# Patient Record
Sex: Female | Born: 1975 | Race: Black or African American | Hispanic: No | Marital: Single | Smoking: Current every day smoker
Health system: Southern US, Community
[De-identification: ages and names within clinical notes are randomized; demographics above are authoritative.]

## PROBLEM LIST (undated history)

## (undated) DIAGNOSIS — D649 Anemia, unspecified: Secondary | ICD-10-CM

## (undated) DIAGNOSIS — E7800 Pure hypercholesterolemia, unspecified: Secondary | ICD-10-CM

## (undated) DIAGNOSIS — I10 Essential (primary) hypertension: Secondary | ICD-10-CM

## (undated) HISTORY — PX: CHOLECYSTECTOMY: SHX55

---

## 2019-05-16 ENCOUNTER — Emergency Department (HOSPITAL_BASED_OUTPATIENT_CLINIC_OR_DEPARTMENT_OTHER)
Admission: EM | Admit: 2019-05-16 | Discharge: 2019-05-16 | Disposition: A | Discharge status: Home / Self Care | Payer: Medicaid - Out of State | Attending: Emergency Medicine | Admitting: Emergency Medicine

## 2019-05-16 ENCOUNTER — Ambulatory Visit: Payer: Medicaid - Out of State | Admitting: Nurse Practitioner

## 2019-05-16 ENCOUNTER — Encounter (HOSPITAL_BASED_OUTPATIENT_CLINIC_OR_DEPARTMENT_OTHER): Payer: Self-pay | Admitting: Emergency Medicine

## 2019-05-16 ENCOUNTER — Emergency Department (HOSPITAL_BASED_OUTPATIENT_CLINIC_OR_DEPARTMENT_OTHER): Payer: Medicaid - Out of State

## 2019-05-16 ENCOUNTER — Other Ambulatory Visit: Payer: Self-pay

## 2019-05-16 DIAGNOSIS — I10 Essential (primary) hypertension: Secondary | ICD-10-CM | POA: Diagnosis not present

## 2019-05-16 DIAGNOSIS — Z79899 Other long term (current) drug therapy: Secondary | ICD-10-CM | POA: Diagnosis not present

## 2019-05-16 DIAGNOSIS — E876 Hypokalemia: Secondary | ICD-10-CM

## 2019-05-16 DIAGNOSIS — R202 Paresthesia of skin: Secondary | ICD-10-CM | POA: Insufficient documentation

## 2019-05-16 DIAGNOSIS — R600 Localized edema: Secondary | ICD-10-CM | POA: Diagnosis present

## 2019-05-16 HISTORY — DX: Pure hypercholesterolemia, unspecified: E78.00

## 2019-05-16 HISTORY — DX: Anemia, unspecified: D64.9

## 2019-05-16 HISTORY — DX: Essential (primary) hypertension: I10

## 2019-05-16 LAB — CBC WITH DIFFERENTIAL/PLATELET
Abs Immature Granulocytes: 0.05 10*3/uL (ref 0.00–0.07)
Basophils Absolute: 0.1 10*3/uL (ref 0.0–0.1)
Basophils Relative: 1 %
Eosinophils Absolute: 0.3 10*3/uL (ref 0.0–0.5)
Eosinophils Relative: 3 %
HCT: 35.9 % — ABNORMAL LOW (ref 36.0–46.0)
Hemoglobin: 11.2 g/dL — ABNORMAL LOW (ref 12.0–15.0)
Immature Granulocytes: 1 %
Lymphocytes Relative: 27 %
Lymphs Abs: 2.8 10*3/uL (ref 0.7–4.0)
MCH: 27.8 pg (ref 26.0–34.0)
MCHC: 31.2 g/dL (ref 30.0–36.0)
MCV: 89.1 fL (ref 80.0–100.0)
Monocytes Absolute: 0.6 10*3/uL (ref 0.1–1.0)
Monocytes Relative: 5 %
Neutro Abs: 6.6 10*3/uL (ref 1.7–7.7)
Neutrophils Relative %: 63 %
Platelets: 513 10*3/uL — ABNORMAL HIGH (ref 150–400)
RBC: 4.03 MIL/uL (ref 3.87–5.11)
RDW: 15.7 % — ABNORMAL HIGH (ref 11.5–15.5)
WBC: 10.4 10*3/uL (ref 4.0–10.5)
nRBC: 0 % (ref 0.0–0.2)

## 2019-05-16 LAB — URINALYSIS, ROUTINE W REFLEX MICROSCOPIC
Bilirubin Urine: NEGATIVE
Glucose, UA: NEGATIVE mg/dL
Ketones, ur: NEGATIVE mg/dL
Leukocytes,Ua: NEGATIVE
Nitrite: NEGATIVE
Protein, ur: NEGATIVE mg/dL
Specific Gravity, Urine: 1.02 (ref 1.005–1.030)
pH: 6.5 (ref 5.0–8.0)

## 2019-05-16 LAB — PREGNANCY, URINE: Preg Test, Ur: NEGATIVE

## 2019-05-16 LAB — COMPREHENSIVE METABOLIC PANEL
ALT: 15 U/L (ref 0–44)
AST: 14 U/L — ABNORMAL LOW (ref 15–41)
Albumin: 3.7 g/dL (ref 3.5–5.0)
Alkaline Phosphatase: 78 U/L (ref 38–126)
Anion gap: 8 (ref 5–15)
BUN: 13 mg/dL (ref 6–20)
CO2: 30 mmol/L (ref 22–32)
Calcium: 8.9 mg/dL (ref 8.9–10.3)
Chloride: 99 mmol/L (ref 98–111)
Creatinine, Ser: 0.77 mg/dL (ref 0.44–1.00)
GFR calc Af Amer: >60 mL/min (ref >60)
GFR calc non Af Amer: >60 mL/min (ref >60)
Glucose, Bld: 101 mg/dL — ABNORMAL HIGH (ref 70–99)
Potassium: 2.9 mmol/L — ABNORMAL LOW (ref 3.5–5.1)
Sodium: 137 mmol/L (ref 135–145)
Total Bilirubin: 0.7 mg/dL (ref 0.3–1.2)
Total Protein: 7.5 g/dL (ref 6.5–8.1)

## 2019-05-16 LAB — URINALYSIS, MICROSCOPIC (REFLEX)

## 2019-05-16 LAB — MAGNESIUM: Magnesium: 2.1 mg/dL (ref 1.7–2.4)

## 2019-05-16 LAB — CK: Total CK: 97 U/L (ref 38–234)

## 2019-05-16 MED ORDER — POTASSIUM CHLORIDE CRYS ER 20 MEQ PO TBCR
40.0000 meq | EXTENDED_RELEASE_TABLET | Freq: Once | ORAL | Status: AC
  Administered 2019-05-16: 40 meq via ORAL
  Filled 2019-05-16: qty 2

## 2019-05-16 MED ORDER — POTASSIUM CHLORIDE CRYS ER 20 MEQ PO TBCR: 40.0000 meq | EXTENDED_RELEASE_TABLET | Freq: Two times a day (BID) | ORAL | 0 refills | Status: DC

## 2019-05-16 NOTE — ED Triage Notes (Signed)
Intermittent Tingling, numbness and tightness in both legs.  Pt states hot and cold.  Occurring for one week.  Left ankle swelling.  No injury.  Pt able to move legs.

## 2019-05-16 NOTE — ED Provider Notes (Signed)
[facility] DEPARTMENT Provider Note   CSN: [id number] Arrival date & time: 05/16/19  1029     History Chief Complaint  Patient presents with  . Leg Pain    Cathy Chang is a 44 y.o. female.  HPI 44 year old female presents with bilateral leg pain.  Ongoing for about a week or so.  Primarily she feels tingling from just proximal to her knee down on both legs.  Also some burning pain.  Sometimes aching.  Today she has noticed a little bit of ankle swelling but otherwise has not really noticed any swelling.  Sometimes swelling at the end of the day but no focal swelling or calf pain/swelling.  The pain seems to come and go.  Currently it is not really present.  No new back pain though she does chronically have some mild low back pain.  No radicular back pain.  No weakness in her extremities. No injuries. No fevers.  Past Medical History:  Diagnosis Date  . Anemia   . Hypercholesteremia   . Hypertension     There are no problems to display for this patient.   Past Surgical History:  Procedure Laterality Date  . CESAREAN SECTION    . CHOLECYSTECTOMY       OB History   No obstetric history on file.     History reviewed. No pertinent family history.  Social History   Tobacco Use  . Smoking status: Never Smoker  . Smokeless tobacco: Never Used  Substance Use Topics  . Alcohol use: Not on file  . Drug use: Not on file    Home Medications Prior to Admission medications   Medication Sig Start Date End Date Taking? Authorizing Provider  amLODipine (NORVASC) 5 MG tablet Take 5 mg by mouth daily. 04/27/19   [provider]  cholecalciferol (VITAMIN D3) 25 MCG (1000 UNIT) tablet Take 1,000 Units by mouth daily. 04/25/19   [provider]  [employer] VITAMIN B12 1000 MCG TBCR Take 1 tablet by mouth daily. 04/27/19   [provider]  EPINEPHrine 0.3 mg/0.3 mL IJ SOAJ injection FOR USE IN CASE OF SEVERE ALLERGIC REACTION, INJECT AND CALL  911 03/28/19   [provider]  metoprolol succinate (TOPROL-XL) 50 MG 24 hr tablet Take 50 mg by mouth daily. 04/27/19   [provider]  potassium chloride SA (KLOR-CON) 20 MEQ tablet Take 2 tablets (40 mEq total) by mouth 2 (two) times daily for 5 days. 05/16/19 05/21/19  Sherwood Gambler, MD  pravastatin (PRAVACHOL) 40 MG tablet Take 40 mg by mouth at bedtime. 04/27/19   [provider]    Allergies    Nsaids and Lisinopril  Review of Systems   Review of Systems  Cardiovascular: Negative for leg swelling.  Gastrointestinal: Negative for diarrhea.  Musculoskeletal: Positive for back pain (chronic, unchanged) and myalgias.  Neurological: Positive for numbness (tingling). Negative for weakness.  All other systems reviewed and are negative.   Physical Exam Updated Vital Signs BP 136/85 (BP Location: Right Arm)   Pulse 81   Temp 98.3 F (36.8 C) (Oral)   Resp 20   Ht 5\' 2"  (1.575 m)   Wt (!) 139.3 kg   LMP 05/03/2019   SpO2 100%   BMI 56.16 kg/m   Physical Exam Vitals and nursing note reviewed.  Constitutional:      General: She is not in acute distress.    Appearance: She is well-developed. She is obese. She is not ill-appearing or diaphoretic.  HENT:     Head: Normocephalic and atraumatic.     Right Ear: External ear normal.     Left Ear: External ear normal.     Nose: Nose normal.  Eyes:     General:        Right eye: No discharge.        Left eye: No discharge.  Cardiovascular:     Rate and Rhythm: Normal rate and regular rhythm.     Pulses:          Dorsalis pedis pulses are 2+ on the right side and 2+ on the left side.     Heart sounds: Normal heart sounds.  Pulmonary:     Effort: Pulmonary effort is normal.     Breath sounds: Normal breath sounds.  Abdominal:     Palpations: Abdomen is soft.  Musculoskeletal:     Thoracic back: No tenderness.     Lumbar back: No tenderness.     Right hip: Normal range of motion.     Left hip:  Normal range of motion.     Right upper leg: No tenderness.     Left upper leg: No tenderness.     Right knee: No swelling. Normal range of motion. No tenderness.     Left knee: No swelling. Normal range of motion. No tenderness.     Right lower leg: No swelling or tenderness.     Left lower leg: No swelling or tenderness.     Right ankle: No swelling. No tenderness.     Left ankle: Swelling (minimal ankle swelling) present. No tenderness. Normal range of motion.  Skin:    General: Skin is warm and dry.  Neurological:     Mental Status: She is alert.     Comments: 5/5 strength in BLE. Normal gross sensation in lower extremities.  Psychiatric:        Mood and Affect: Mood is not anxious.     ED Results / Procedures / Treatments   Labs (all labs ordered are listed, but only abnormal results are displayed) Labs Reviewed  URINALYSIS, ROUTINE W REFLEX MICROSCOPIC - Abnormal; Notable for the following components:      Result Value   APPearance HAZY (*)    Hgb urine dipstick TRACE (*)    All other components within normal limits  COMPREHENSIVE METABOLIC PANEL - Abnormal; Notable for the following components:   Potassium 2.9 (*)    Glucose, Bld 101 (*)    AST 14 (*)    All other components within normal limits  CBC WITH DIFFERENTIAL/PLATELET - Abnormal; Notable for the following components:   Hemoglobin 11.2 (*)    HCT 35.9 (*)    RDW 15.7 (*)    Platelets 513 (*)    All other components within normal limits  URINALYSIS, MICROSCOPIC (REFLEX) - Abnormal; Notable for the following components:   Bacteria, UA FEW (*)    All other components within normal limits  PREGNANCY, URINE  MAGNESIUM  CK    EKG EKG Interpretation  Date/Time:  Monday May 16 2019 12:13:08 EST Ventricular Rate:  76 PR Interval:    QRS Duration: 97 QT Interval:  383 QTC Calculation: 431 R Axis:   48 Text Interpretation: Sinus rhythm Low voltage, precordial leads Baseline wander in lead(s) I III aVR  aVL aVF V3 No old tracing to compare Confirmed by Pricilla Loveless [phone]) on 05/16/2019 12:16:00 PM   Radiology US Venous Img Lower Bilateral  Result Date: 05/16/2019 CLINICAL DATA:  44 year old female with a history of calf pain EXAM: BILATERAL LOWER EXTREMITY VENOUS DOPPLER ULTRASOUND TECHNIQUE: Gray-scale sonography with graded compression, as well as color Doppler and duplex ultrasound were performed to evaluate the lower extremity deep venous systems from the level of the common femoral vein and including the common femoral, femoral, profunda femoral, popliteal and calf veins including the posterior tibial, peroneal and gastrocnemius veins when visible. The superficial great saphenous vein was also interrogated. Spectral Doppler was utilized to evaluate flow at rest and with distal augmentation maneuvers in the common femoral, femoral and popliteal veins. COMPARISON:  None. FINDINGS: RIGHT LOWER EXTREMITY Common Femoral Vein: No evidence of thrombus. Normal compressibility, respiratory phasicity and response to augmentation. Saphenofemoral Junction: No evidence of thrombus. Normal compressibility and flow on color Doppler imaging. Profunda Femoral Vein: No evidence of thrombus. Normal compressibility and flow on color Doppler imaging. Femoral Vein: No evidence of thrombus. Normal compressibility, respiratory phasicity and response to augmentation. Popliteal Vein: No evidence of thrombus. Normal compressibility, respiratory phasicity and response to augmentation. Calf Veins: No evidence of thrombus. Normal compressibility and flow on color Doppler imaging. Superficial Great Saphenous Vein: No evidence of thrombus. Normal compressibility and flow on color Doppler imaging. Other Findings:  None. LEFT LOWER EXTREMITY Common Femoral Vein: No evidence of thrombus. Normal compressibility, respiratory phasicity and response to augmentation. Saphenofemoral Junction: No evidence of thrombus. Normal compressibility and  flow on color Doppler imaging. Profunda Femoral Vein: No evidence of thrombus. Normal compressibility and flow on color Doppler imaging. Femoral Vein: No evidence of thrombus. Normal compressibility, respiratory phasicity and response to augmentation. Popliteal Vein: No evidence of thrombus. Normal compressibility, respiratory phasicity and response to augmentation. Calf Veins: No evidence of thrombus. Normal compressibility and flow on color Doppler imaging. Superficial Great Saphenous Vein: No evidence of thrombus. Normal compressibility and flow on color Doppler imaging. Other Findings:  None. IMPRESSION: Sonographic survey of the bilateral lower extremities negative for DVT Electronically Signed   By: Gilmer Mor D.O.   On: 05/16/2019 12:21    Procedures Procedures (including critical care time)  Medications Ordered in ED Medications  potassium chloride SA (KLOR-CON) CR tablet 40 mEq (40 mEq Oral Given 05/16/19 1205)    ED Course  I have reviewed the triage vital signs and the nursing notes.  Pertinent labs & imaging results that were available during my care of the patient were reviewed by me and considered in my medical decision making (see chart for details).    MDM Rules/Calculators/A&P                      Patient is anxious about possibly having a DVT.  DVT ultrasound is negative.  She has no unilateral swelling or even swelling compared to her baseline.  Probably her paresthesia type symptoms are coming from moderate hypokalemia.  ECG is unremarkable for changes related to potassium.  She appears stable for outpatient repletement and follow-up with her PCP.  Unclear exact cause though Norvasc or metoprolol probably would not be causing it.  Otherwise her neurologic exam is benign including normal strength and sensation.  Will discharge home with return precautions. Final Clinical Impression(s) / ED Diagnoses Final diagnoses:  Hypokalemia  Paresthesia of bilateral legs    Rx / DC  Orders ED Discharge Orders         Ordered    potassium chloride SA (KLOR-CON) 20 MEQ tablet  2 times daily     05/16/19 1230  Pricilla Loveless, MD 05/16/19 1233

## 2019-05-18 ENCOUNTER — Ambulatory Visit: Payer: Self-pay | Admitting: Adult Health Nurse Practitioner

## 2019-05-20 ENCOUNTER — Encounter: Payer: Self-pay | Admitting: Adult Health Nurse Practitioner

## 2019-05-24 ENCOUNTER — Encounter: Payer: Self-pay | Admitting: Adult Health Nurse Practitioner

## 2019-06-27 ENCOUNTER — Telehealth: Payer: Self-pay

## 2019-06-27 ENCOUNTER — Encounter: Payer: Self-pay | Admitting: Adult Health Nurse Practitioner

## 2019-06-27 ENCOUNTER — Ambulatory Visit: Payer: Medicaid - Out of State

## 2019-06-27 NOTE — Telephone Encounter (Signed)
Pt was a NS for 0845 appt in [facility] on 06/27/19.  TC place to pt to offer reschedule, no answer.  Pt needs in person appt. SChaplin, RN,BSN

## 2019-07-19 ENCOUNTER — Other Ambulatory Visit: Payer: Self-pay

## 2019-07-19 ENCOUNTER — Encounter: Payer: Self-pay | Admitting: Internal Medicine

## 2019-07-19 ENCOUNTER — Ambulatory Visit: Payer: No Typology Code available for payment source | Admitting: Internal Medicine

## 2019-07-19 ENCOUNTER — Ambulatory Visit (HOSPITAL_COMMUNITY)
Admission: RE | Admit: 2019-07-19 | Discharge: 2019-07-19 | Disposition: A | Discharge status: Home / Self Care | Payer: No Typology Code available for payment source | Source: Ambulatory Visit | Attending: Internal Medicine | Admitting: Internal Medicine

## 2019-07-19 VITALS — BP 138/75 | HR 90 | Temp 98.5°F | Ht 62.0 in | Wt 302.6 lb

## 2019-07-19 DIAGNOSIS — M25562 Pain in left knee: Secondary | ICD-10-CM | POA: Insufficient documentation

## 2019-07-19 DIAGNOSIS — F321 Major depressive disorder, single episode, moderate: Secondary | ICD-10-CM

## 2019-07-19 DIAGNOSIS — D649 Anemia, unspecified: Secondary | ICD-10-CM

## 2019-07-19 DIAGNOSIS — M25561 Pain in right knee: Secondary | ICD-10-CM | POA: Diagnosis not present

## 2019-07-19 DIAGNOSIS — E876 Hypokalemia: Secondary | ICD-10-CM | POA: Diagnosis not present

## 2019-07-19 DIAGNOSIS — G8929 Other chronic pain: Secondary | ICD-10-CM

## 2019-07-19 DIAGNOSIS — R3911 Hesitancy of micturition: Secondary | ICD-10-CM

## 2019-07-19 DIAGNOSIS — Z79899 Other long term (current) drug therapy: Secondary | ICD-10-CM

## 2019-07-19 DIAGNOSIS — D509 Iron deficiency anemia, unspecified: Secondary | ICD-10-CM

## 2019-07-19 DIAGNOSIS — I10 Essential (primary) hypertension: Secondary | ICD-10-CM | POA: Diagnosis not present

## 2019-07-19 NOTE — Patient Instructions (Signed)
Cathy Chang, It was a pleasure meeting you today. We are glad to have you establishing in our clinic.   Today we discussed: 1. Your blood pressure: I will have you stop your current medications. I'm going to check some labs today and call you in a medication depending on the results. I will let you know when I have these results back.   2. Knee pain - we will get x-rays today.   3. Mental health - I have referred you to [facility], who is our in-house behavioral counselor   I am also checking some other labs to look into your potassium and your anemia  nd will let you know the results of this testing.   We'll plan to have you back in 2-3 weeks to meet your primary care doctor and continue addressing your concerns.   Take care,  Dr. Chesley Mires

## 2019-07-19 NOTE — Progress Notes (Signed)
New Patient Office Visit  Subjective:  Patient ID: Cathy Chang, female    DOB: 05-02-1975  Age: 44 y.o. MRN: [id number]  CC:  Chief Complaint  Patient presents with  . Knee Pain    HPI Cathy Chang presents to establish care for management of chronic HTN, knee pain, depression. Please see problem based charting for details regarding today's visit.   Past Medical History:  Diagnosis Date  . Anemia   . Hypercholesteremia   . Hypertension     Past Surgical History:  Procedure Laterality Date  . CESAREAN SECTION    . CHOLECYSTECTOMY      Family History  Problem Relation Age of Onset  . Lung cancer Mother   . Diabetes Father   . Hypertension Father   . Lung cancer Maternal Grandmother     Social History   Socioeconomic History  . Marital status: Single    Spouse name: Not on file  . Number of children: Not on file  . Years of education: Not on file  . Highest education level: Not on file  Occupational History  . Not on file  Tobacco Use  . Smoking status: Never Smoker  . Smokeless tobacco: Never Used  Substance and Sexual Activity  . Alcohol use: Yes    Comment: occasional   . Drug use: Yes    Types: Marijuana    Comment: daily   . Sexual activity: Not on file  Other Topics Concern  . Not on file  Social History Narrative  . Not on file   Social Determinants of Health   Financial Resource Strain:   . Difficulty of Paying Living Expenses:   Food Insecurity:   . Worried About Programme researcher, broadcasting/film/video in the Last Year:   . Barista in the Last Year:   Transportation Needs:   . Freight forwarder (Medical):   Marland Kitchen Lack of Transportation (Non-Medical):   Physical Activity:   . Days of Exercise per Week:   . Minutes of Exercise per Session:   Stress:   . Feeling of Stress :   Social Connections:   . Frequency of Communication with Friends and Family:   . Frequency of Social Gatherings with Friends and Family:   . Attends Religious Services:     . Active Member of Clubs or Organizations:   . Attends Banker Meetings:   Marland Kitchen Marital Status:   Intimate Partner Violence:   . Fear of Current or Ex-Partner:   . Emotionally Abused:   Marland Kitchen Physically Abused:   . Sexually Abused:     ROS Review of Systems  Constitutional: Negative for activity change, fever and unexpected weight change.  HENT: Negative for congestion, sinus pain and sore throat.   Eyes: Negative for discharge and visual disturbance.  Respiratory: Negative for cough and shortness of breath.   Cardiovascular: Negative for chest pain and palpitations.  Gastrointestinal: Negative for abdominal pain, nausea and vomiting.  Genitourinary: Positive for difficulty urinating. Negative for hematuria and menstrual problem.  Musculoskeletal: Positive for arthralgias.  Skin: Negative for rash.  Neurological: Negative for dizziness, weakness, light-headedness and headaches.  Psychiatric/Behavioral: Negative for agitation, hallucinations and suicidal ideas.    Objective:   Today's Vitals: BP 138/75 (BP Location: Right Arm, Patient Position: Sitting, Cuff Size: Small)   Pulse 90   Temp 98.5 F (36.9 C) (Oral)   Ht 5\' 2"  (1.575 m)   Wt (!) 302 lb 9.6 oz (137.3 kg)  SpO2 100%   BMI 55.35 kg/m   Physical Exam Constitutional:      General: She is not in acute distress.    Appearance: Normal appearance.  Eyes:     Conjunctiva/sclera: Conjunctivae normal.  Cardiovascular:     Rate and Rhythm: Normal rate and regular rhythm.  Pulmonary:     Effort: Pulmonary effort is normal.     Breath sounds: Normal breath sounds.  Abdominal:     General: Bowel sounds are normal. There is no distension.     Palpations: Abdomen is soft.     Tenderness: There is no abdominal tenderness.  Musculoskeletal:     Cervical back: Neck supple.     Left knee: Crepitus present. No effusion. Decreased range of motion. Tenderness present over the medial joint line and lateral joint line.  No LCL laxity, MCL laxity, ACL laxity or PCL laxity.    Right lower leg: Edema present.     Left lower leg: Edema present.  Skin:    General: Skin is warm and dry.  Neurological:     General: No focal deficit present.     Mental Status: She is alert and oriented to person, place, and time.  Psychiatric:        Mood and Affect: Mood normal.        Behavior: Behavior normal.     Assessment & Plan:   Problem List Items Addressed This Visit      Cardiovascular and Mediastinum   Essential hypertension    Patient has been on amlodipine and Metoprolol for over 10 years to treat her HTN. She has noticed lower extremity swelling recently and would like to try alternative therapy. I think she would respond well to HCTZ, but with history of hypokalemia will combine it with triamterene. Re-check BMP at upcoming PCP visit on 4/20.       Relevant Medications   triamterene-hydrochlorothiazide (MAXZIDE-25) 37.5-25 MG tablet     Other   Hypokalemia    Patient was found to have a potassium of 2.9 at recent ED visit in February. She was prescribed supplementation. Re-check today shows normal value of 3.8.  Will obtain aldosterone/renin to screen for primary aldosteronism given concurrent HTN as well.       Relevant Orders   BMP8+Anion Gap (Completed)   Aldosterone/Renin   Chronic pain of both knees - Primary    Progressive knee pain, L>R. X-rays are unremarkable. She denies recent trauma or an inciting event. I suspect the primary cause of her knee pain is her weight which we discussed today. She had mentioned being interested in bariatric surgery referral which I think she would be a good candidate for. Can continue discussing this with her PCP. Would advise conservative management for knee pain with NSAIDs and staying as mobile as possible for now.       Relevant Orders   DG Knee Complete 4 Views Left (Completed)   DG Knee Complete 4 Views Right (Completed)   Anemia    Recent CBC from ED  visit shows anemia with elevated RDW and platelet count, indicating possible iron deficiency. Iron studies at today's visit confirm this. She reports that she has gotten iron transfusions in the past. Will attempt oral replacement therapy first and have her PCP re-check in 6 weeks.       Relevant Medications   ferrous sulfate 325 (65 FE) MG tablet   Other Relevant Orders   Iron and IBC ([id number]) (Completed)   Ferritin (Completed)  Current moderate episode of major depressive disorder [facility])    Patient reports longstanding psychiatric history related to difficult childhood and trauma of losing her son to a police shooting. She has previously been on various medications including SSRIs and benzos. Not currently on any therapy. She would prefer to try behavioral therapy first so will place referral to [facility] today.       Relevant Orders   Ambulatory referral to Integrated Behavioral [facility]    Other Visit Diagnoses    Urinary hesitancy       Relevant Orders   Urinalysis, Complete (81001) (Completed)      Outpatient Encounter Medications as of 07/19/2019  Medication Sig  . amLODipine (NORVASC) 5 MG tablet Take 5 mg by mouth daily.  . cholecalciferol (VITAMIN D3) 25 MCG (1000 UNIT) tablet Take 1,000 Units by mouth daily.  . [employer]S VITAMIN B12 1000 MCG TBCR Take 1 tablet by mouth daily.  Marland Kitchen EPINEPHrine 0.3 mg/0.3 mL IJ SOAJ injection FOR USE IN CASE OF SEVERE ALLERGIC REACTION, INJECT AND CALL 911  . ferrous sulfate 325 (65 FE) MG tablet Take 1 tablet (325 mg total) by mouth daily with breakfast.  . metoprolol succinate (TOPROL-XL) 50 MG 24 hr tablet Take 50 mg by mouth daily.  . pravastatin (PRAVACHOL) 40 MG tablet Take 40 mg by mouth at bedtime.  . triamterene-hydrochlorothiazide (MAXZIDE-25) 37.5-25 MG tablet Take 1 tablet by mouth daily.  . [DISCONTINUED] potassium chloride SA (KLOR-CON) 20 MEQ tablet Take 2 tablets (40 mEq total) by mouth 2 (two) times daily for 5 days.   No  facility-administered encounter medications on file as of 07/19/2019.    Follow-up: Return in about 4 weeks (around 08/16/2019) for HTN, establish with PCP (prefers female).   Bridget Hartshorn, DO

## 2019-07-20 ENCOUNTER — Telehealth: Payer: Self-pay | Admitting: Internal Medicine

## 2019-07-20 ENCOUNTER — Encounter: Payer: Self-pay | Admitting: Internal Medicine

## 2019-07-20 DIAGNOSIS — D649 Anemia, unspecified: Secondary | ICD-10-CM | POA: Insufficient documentation

## 2019-07-20 DIAGNOSIS — E876 Hypokalemia: Secondary | ICD-10-CM | POA: Insufficient documentation

## 2019-07-20 DIAGNOSIS — G8929 Other chronic pain: Secondary | ICD-10-CM | POA: Insufficient documentation

## 2019-07-20 DIAGNOSIS — I10 Essential (primary) hypertension: Secondary | ICD-10-CM | POA: Insufficient documentation

## 2019-07-20 DIAGNOSIS — F321 Major depressive disorder, single episode, moderate: Secondary | ICD-10-CM | POA: Insufficient documentation

## 2019-07-20 DIAGNOSIS — D509 Iron deficiency anemia, unspecified: Secondary | ICD-10-CM | POA: Insufficient documentation

## 2019-07-20 LAB — URINALYSIS, COMPLETE
Bilirubin, UA: NEGATIVE
Glucose, UA: NEGATIVE
Leukocytes,UA: NEGATIVE
Nitrite, UA: NEGATIVE
RBC, UA: NEGATIVE
Specific Gravity, UA: 1.029 (ref 1.005–1.030)
Urobilinogen, Ur: 1 mg/dL (ref 0.2–1.0)
pH, UA: 6.5 (ref 5.0–7.5)

## 2019-07-20 LAB — BMP8+ANION GAP
Anion Gap: 17 mmol/L (ref 10.0–18.0)
BUN/Creatinine Ratio: 11 (ref 9–23)
BUN: 9 mg/dL (ref 6–24)
CO2: 25 mmol/L (ref 20–29)
Calcium: 9.5 mg/dL (ref 8.7–10.2)
Chloride: 97 mmol/L (ref 96–106)
Creatinine, Ser: 0.79 mg/dL (ref 0.57–1.00)
GFR calc Af Amer: 105 mL/min/{1.73_m2} (ref >59)
GFR calc non Af Amer: 91 mL/min/{1.73_m2} (ref >59)
Glucose: 101 mg/dL — ABNORMAL HIGH (ref 65–99)
Potassium: 3.8 mmol/L (ref 3.5–5.2)
Sodium: 139 mmol/L (ref 134–144)

## 2019-07-20 LAB — IRON AND TIBC
Iron Saturation: 7 % — CL (ref 15–55)
Iron: 27 ug/dL (ref 27–159)
Total Iron Binding Capacity: 393 ug/dL (ref 250–450)
UIBC: 366 ug/dL (ref 131–425)

## 2019-07-20 LAB — MICROSCOPIC EXAMINATION
Casts: NONE SEEN /lpf
RBC, Urine: >30 /hpf — AB (ref 0–2)
WBC, UA: >30 /hpf — AB (ref 0–5)

## 2019-07-20 LAB — FERRITIN: Ferritin: 7 ng/mL — ABNORMAL LOW (ref 15–150)

## 2019-07-20 MED ORDER — TRIAMTERENE-HCTZ 37.5-25 MG PO TABS: 1.0000 | ORAL_TABLET | Freq: Every day | ORAL | 2 refills | Status: DC

## 2019-07-20 MED ORDER — FERROUS SULFATE 325 (65 FE) MG PO TABS: 325.0000 mg | ORAL_TABLET | Freq: Every day | ORAL | 3 refills | Status: DC

## 2019-07-20 NOTE — Assessment & Plan Note (Signed)
Patient was found to have a potassium of 2.9 at recent ED visit in February. She was prescribed supplementation. Re-check today shows normal value of 3.8.  Will obtain aldosterone/renin to screen for primary aldosteronism given concurrent HTN as well.

## 2019-07-20 NOTE — Assessment & Plan Note (Signed)
Patient reports longstanding psychiatric history related to difficult childhood and trauma of losing her son to a police shooting. She has previously been on various medications including SSRIs and benzos. Not currently on any therapy. She would prefer to try behavioral therapy first so will place referral to [facility] today.

## 2019-07-20 NOTE — Progress Notes (Signed)
Internal Medicine Clinic Attending  Case discussed with Dr. Bloomfield at the time of the visit.  We reviewed the resident's history and exam and pertinent patient test results.  I agree with the assessment, diagnosis, and plan of care documented in the resident's note.  

## 2019-07-20 NOTE — Assessment & Plan Note (Signed)
Progressive knee pain, L>R. X-rays are unremarkable. She denies recent trauma or an inciting event. I suspect the primary cause of her knee pain is her weight which we discussed today. She had mentioned being interested in bariatric surgery referral which I think she would be a good candidate for. Can continue discussing this with her PCP. Would advise conservative management for knee pain with NSAIDs and staying as mobile as possible for now.

## 2019-07-20 NOTE — Telephone Encounter (Signed)
Called patient and left secure voicemail. Lab results showed iron deficiency; have sent in prescription for daily supplementation and will need re-check in about 6 weeks.  Her potassium level has normalized, so have sent in the new blood pressure medication that we discussed at her visit yesterday (HCTZ-triamterene).

## 2019-07-20 NOTE — Assessment & Plan Note (Signed)
Recent CBC from ED visit shows anemia with elevated RDW and platelet count, indicating possible iron deficiency. Iron studies at today's visit confirm this. She reports that she has gotten iron transfusions in the past. Will attempt oral replacement therapy first and have her PCP re-check in 6 weeks.

## 2019-07-20 NOTE — Assessment & Plan Note (Signed)
Patient has been on amlodipine and Metoprolol for over 10 years to treat her HTN. She has noticed lower extremity swelling recently and would like to try alternative therapy. I think she would respond well to HCTZ, but with history of hypokalemia will combine it with triamterene. Re-check BMP at upcoming PCP visit on 4/20.

## 2019-07-21 ENCOUNTER — Telehealth: Payer: Self-pay

## 2019-07-21 ENCOUNTER — Other Ambulatory Visit: Payer: Self-pay | Admitting: Internal Medicine

## 2019-07-21 DIAGNOSIS — D500 Iron deficiency anemia secondary to blood loss (chronic): Secondary | ICD-10-CM

## 2019-07-21 NOTE — Progress Notes (Signed)
Patient noted to have iron deficiency anemia secondary to menorrhagia. She does not tolerate oral iron supplementation so will proceed with iron transfusion.

## 2019-07-21 NOTE — Telephone Encounter (Signed)
Received TC from patient, states she can't take oral iron pills.  She has tried to take this many times in the past and has N/V.  She was on iron infusions.  Wants to speak to Dr. Chesley Mires about this.  Pt is calling her previous PCP to get records. Call her on her mobile. SChaplin, RN,BSN

## 2019-07-21 NOTE — Telephone Encounter (Signed)
Returned patient's call. I had ordered for 2 doses of IV feraheme, but patient would prefer to hold off until we can get her records from [place] so we can see what specifically they gave her due to allergy concerns. Provided her with fax number for clinic.

## 2019-07-27 LAB — ALDOSTERONE + RENIN ACTIVITY W/ RATIO
ALDOS/RENIN RATIO: 38.2 — ABNORMAL HIGH (ref 0.0–30.0)
ALDOSTERONE: 56.7 ng/dL — ABNORMAL HIGH (ref 0.0–30.0)
Renin: 1.484 ng/mL/hr (ref 0.167–5.380)

## 2019-07-28 ENCOUNTER — Ambulatory Visit (INDEPENDENT_AMBULATORY_CARE_PROVIDER_SITE_OTHER): Payer: No Typology Code available for payment source | Admitting: Internal Medicine

## 2019-07-28 ENCOUNTER — Telehealth: Payer: Self-pay | Admitting: Internal Medicine

## 2019-07-28 DIAGNOSIS — M25562 Pain in left knee: Secondary | ICD-10-CM

## 2019-07-28 DIAGNOSIS — G8929 Other chronic pain: Secondary | ICD-10-CM

## 2019-07-28 MED ORDER — DICLOFENAC SODIUM 1 % EX GEL: 4.0000 g | Freq: Four times a day (QID) | CUTANEOUS | 1 refills | Status: AC

## 2019-07-28 NOTE — Telephone Encounter (Signed)
Pt calls and states she is not any better in fact she is worse.  Acc [id number] 4/15

## 2019-07-28 NOTE — Assessment & Plan Note (Addendum)
Patient continues to have left knee pain with intermittent swelling. This has been going on for approximately 6 months, but seems to be worsening recently. She denies any trauma or injury. Endorses occasional buckling or the sensation that it is "giving out." X-ray was unremarkable.  She is hesitant to take any systemic NSAIDs for pain due to prior adverse effects. Will send in topical Voltaren for pain. MRI of left knee ordered and have referred her to orthopedics.  We have discussed importance of weight loss and the benefits it will have on her knee pain in particular. She is motivated to do so and plans on discussing possibility of bariatric surgery in the future with her PCP.

## 2019-07-28 NOTE — Telephone Encounter (Signed)
Pt is wanting a nurse call back [phone]

## 2019-07-28 NOTE — Progress Notes (Signed)
  [facility] Res[facility] Telephone Encounter Continuity Care Appointment  HPI:   This telephone encounter was created for [name] on 08/02/2019 for the following purpose/cc left knee.   Past Medical History:  Past Medical History:  Diagnosis Date  . Anemia   . Hypercholesteremia   . Hypertension      ROS:   No history of trauma, recent falls. No lower extremity weakness.    Assessment / Plan / Recommendations:   Please see A&P under problem oriented charting for assessment of the patient's acute and chronic medical conditions.   As always, pt is advised that if symptoms worsen or new symptoms arise, they should go to an urgent care facility or to to ER for further evaluation.   Consent and Medical Decision Making:   Patient discussed with Dr. Criselda Peaches  This is a telephone encounter between [facility] and Bridget Hartshorn on 08/02/2019 for left knee pain. The visit was conducted with the patient located at home and Bridget Hartshorn at [facility]. The patient's identity was confirmed using their DOB and current address. The patient has consented to being evaluated through a telephone encounter and understands the associated risks (an examination cannot be done and the patient may need to come in for an appointment) / benefits (allows the patient to remain at home, decreasing exposure to coronavirus). I personally spent 22 minutes on medical discussion.

## 2019-08-01 ENCOUNTER — Telehealth: Payer: Self-pay | Admitting: Licensed Clinical Social Worker

## 2019-08-01 NOTE — Telephone Encounter (Signed)
Patient was called to discuss referral for services. Patient reported that she was unsure about services at this time, and that she would call the office back at a time that was convenient for her.

## 2019-08-02 ENCOUNTER — Encounter: Payer: Self-pay | Admitting: Internal Medicine

## 2019-08-02 ENCOUNTER — Ambulatory Visit (INDEPENDENT_AMBULATORY_CARE_PROVIDER_SITE_OTHER): Payer: No Typology Code available for payment source | Admitting: Internal Medicine

## 2019-08-02 ENCOUNTER — Other Ambulatory Visit: Payer: Self-pay

## 2019-08-02 VITALS — BP 137/73 | HR 74 | Temp 98.4°F | Ht 62.0 in | Wt 307.1 lb

## 2019-08-02 DIAGNOSIS — E876 Hypokalemia: Secondary | ICD-10-CM

## 2019-08-02 DIAGNOSIS — F329 Major depressive disorder, single episode, unspecified: Secondary | ICD-10-CM

## 2019-08-02 DIAGNOSIS — Z6841 Body Mass Index (BMI) 40.0 and over, adult: Secondary | ICD-10-CM | POA: Insufficient documentation

## 2019-08-02 DIAGNOSIS — E785 Hyperlipidemia, unspecified: Secondary | ICD-10-CM | POA: Insufficient documentation

## 2019-08-02 DIAGNOSIS — N898 Other specified noninflammatory disorders of vagina: Secondary | ICD-10-CM

## 2019-08-02 DIAGNOSIS — D509 Iron deficiency anemia, unspecified: Secondary | ICD-10-CM

## 2019-08-02 DIAGNOSIS — E7800 Pure hypercholesterolemia, unspecified: Secondary | ICD-10-CM | POA: Diagnosis not present

## 2019-08-02 DIAGNOSIS — D500 Iron deficiency anemia secondary to blood loss (chronic): Secondary | ICD-10-CM

## 2019-08-02 DIAGNOSIS — I10 Essential (primary) hypertension: Secondary | ICD-10-CM | POA: Diagnosis not present

## 2019-08-02 DIAGNOSIS — M25562 Pain in left knee: Secondary | ICD-10-CM | POA: Diagnosis not present

## 2019-08-02 DIAGNOSIS — Z79899 Other long term (current) drug therapy: Secondary | ICD-10-CM

## 2019-08-02 DIAGNOSIS — G8929 Other chronic pain: Secondary | ICD-10-CM

## 2019-08-02 NOTE — Patient Instructions (Addendum)
Cathy Chang,   It was a pleasure seeing you in clinic today. Today we discussed:  1. Hypertension - Please continue to take your medications as prescribed. I will check lab work today. You would benefit from continued weight loss and can initiate your bariatric surgery process by visiting the following website and attending a weight loss seminar: [email or web address]  2. Anemia - I will order your iron infusions and check further labs for vitamin B12 and vitamin D.  3. Vaginal discharge - Please schedule an appointment for a pap smear at your earliest convenience.    Please contact us if you have any questions or concerns at [phone]   Thank you!

## 2019-08-02 NOTE — Assessment & Plan Note (Addendum)
Pateint with history of iron deficiency anemia requiring frequent iron infusions while under the care of her PCP and hematologist. Patient notes that she had adverse reactions with other iron infusions and notes that she is only able to tolerate Venofer 200mg  IV. She notes that her hematologist has recommended this and also recommended methylprednisolone 125mg  IV if she were to have any adverse reactions.  - IV venofer 200mg  IV weekly - Vitamin D and B12 levels

## 2019-08-02 NOTE — Assessment & Plan Note (Signed)
Patient found to be hypokalemic on ED visit in 05/2019 and prescribed supplementation. On 4/7, repeat labs with K wnl. Aldosterone/renin ratio obtained to screen for primary hyperaldosteronism given her long standing hypertension. Aldosterone and aldosterone/renin ratio elevated; however, her hypertension seems to be responding appropriately to medications at this time. Will continue to monitor.

## 2019-08-02 NOTE — Progress Notes (Signed)
   CC: hypertension f/u  HPI:  [name] is a 44 y.o. female with PMHx of hypertension, iron deficiency anemia, and hypercholesterolemia presenting for follow up of her hypertension. Please see problem based charting for further assessment and plan.  Past Medical History:  Diagnosis Date  . Anemia   . Hypercholesteremia   . Hypertension    Review of Systems:   Review of Systems  Constitutional: Positive for malaise/fatigue. Negative for chills, fever and weight loss.  Eyes: Negative for blurred vision and double vision.  Respiratory: Negative for cough, shortness of breath and wheezing.   Cardiovascular: Negative for chest pain and palpitations.  Gastrointestinal: Negative for abdominal pain, diarrhea and vomiting.  Genitourinary: Negative for dysuria.       Vaginal discharge - brownish in color   Musculoskeletal: Positive for joint pain. Negative for back pain, myalgias and neck pain.  Neurological: Negative for dizziness, tingling, sensory change, focal weakness, weakness and headaches.  Psychiatric/Behavioral: Positive for depression. Negative for hallucinations, memory loss and suicidal ideas. The patient is not nervous/anxious and does not have insomnia.      Physical Exam: Vitals:   08/02/19 1549  BP: 137/73  Pulse: 74  Temp: 98.4 F (36.9 C)  TempSrc: Oral  SpO2: 98%  Weight: (!) 307 lb 1.6 oz (139.3 kg)  Height: 5\' 2"  (1.575 m)   Physical Exam Constitutional:      General: She is not in acute distress.    Appearance: She is obese. She is not diaphoretic.  HENT:     Head: Normocephalic and atraumatic.  Eyes:     Extraocular Movements: Extraocular movements intact.     Conjunctiva/sclera: Conjunctivae normal.  Cardiovascular:     Rate and Rhythm: Normal rate and regular rhythm.     Pulses: Normal pulses.     Heart sounds: Normal heart sounds. No murmur. No friction rub. No gallop.   Pulmonary:     Effort: Pulmonary effort is normal. No respiratory  distress.     Breath sounds: Normal breath sounds. No wheezing.  Abdominal:     General: Bowel sounds are normal.     Palpations: Abdomen is soft.     Tenderness: There is no abdominal tenderness. There is no guarding.  Musculoskeletal:        General: No swelling or tenderness. Normal range of motion.  Skin:    General: Skin is warm and dry.     Capillary Refill: Capillary refill takes less than 2 seconds.     Findings: No erythema.  Neurological:     General: No focal deficit present.     Mental Status: She is alert and oriented to person, place, and time. Mental status is at baseline.     Sensory: No sensory deficit.     Motor: No weakness.      Assessment & Plan:   See Encounters Tab for problem based charting.  Patient discussed with Dr. 

## 2019-08-02 NOTE — Assessment & Plan Note (Signed)
Patient on chronic statin therapy with pravastatin 40mg  daily. Has been tolerating well.   - Lipid panel today.

## 2019-08-02 NOTE — Assessment & Plan Note (Signed)
Patient reports ongoing left knee pain with intermittent swelling that is unchanged from prior exam. Patient's prior x-rays unremarkable and pain is persistent despite topical voltaren gel. MRI left knee ordered and patient has appointment with orthopedic surgeon tomorrow.  - F/u MRI - F/u orthopedic surgery recommendations

## 2019-08-02 NOTE — Assessment & Plan Note (Addendum)
Patient with BMI 56. Discussed and encouraged weight loss. Patient expresses interest at this time. She notes that she is motivated to lose weight and is interested in bariatric surgery. Reference given to attend seminar.  Patient also recommended for lifestyle modification with exercise and low sodium diet.  -Referral to bariatric surgeon once patient has completed seminar  - Encourage lifestyle modification

## 2019-08-02 NOTE — Assessment & Plan Note (Signed)
BP 137/73 at this visit. Goal 120/80. Patient previously on metoprolol and amlodipine for >10 years for hypertension. However, at previous visit patient started on triamterene-HCTZ for concerns of lower extremity edema. Patient notes improvement in her edema today. BP is stable since last visit.  Renin, aldosterone and aldosterone/renin ratio checked at last visit. Aldosterone and A:R ratio elevated which would be concerning for primary hyperaldosteronism; however, patient is responding well to current therapy and does not appear that this is resistant hypertension at this time.   - Continue triamterene-HCTZ 37.5-25mg  daily  - BMP today - Lifestyle modifications encouraged

## 2019-08-03 NOTE — Progress Notes (Signed)
Internal Medicine Clinic Attending  Case discussed with Dr. Bloomfield  at the time of the visit.  We reviewed the resident's history and pertinent patient test results.  I agree with the assessment, diagnosis, and plan of care documented in the resident's note.  

## 2019-08-03 NOTE — Progress Notes (Signed)
Internal Medicine Clinic Attending  Case discussed with Dr. Aslam at the time of the visit.  We reviewed the resident's history and exam and pertinent patient test results.  I agree with the assessment, diagnosis, and plan of care documented in the resident's note.  

## 2019-08-04 ENCOUNTER — Ambulatory Visit: Payer: No Typology Code available for payment source | Admitting: Orthopaedic Surgery

## 2019-08-11 LAB — BMP8+ANION GAP
Anion Gap: 16 mmol/L (ref 10.0–18.0)
BUN/Creatinine Ratio: 18 (ref 9–23)
BUN: 14 mg/dL (ref 6–24)
CO2: 25 mmol/L (ref 20–29)
Calcium: 9 mg/dL (ref 8.7–10.2)
Chloride: 98 mmol/L (ref 96–106)
Creatinine, Ser: 0.8 mg/dL (ref 0.57–1.00)
GFR calc Af Amer: 104 mL/min/{1.73_m2} (ref >59)
GFR calc non Af Amer: 90 mL/min/{1.73_m2} (ref >59)
Glucose: 89 mg/dL (ref 65–99)
Potassium: 3.3 mmol/L — ABNORMAL LOW (ref 3.5–5.2)
Sodium: 139 mmol/L (ref 134–144)

## 2019-08-11 LAB — VITAMIN D 1,25 DIHYDROXY
Vitamin D 1, 25 (OH)2 Total: 56 pg/mL
Vitamin D2 1, 25 (OH)2: <10 pg/mL
Vitamin D3 1, 25 (OH)2: 50 pg/mL

## 2019-08-11 LAB — LIPID PANEL
Chol/HDL Ratio: 4.2 ratio (ref 0.0–4.4)
Cholesterol, Total: 183 mg/dL (ref 100–199)
HDL: 44 mg/dL (ref >39)
LDL Chol Calc (NIH): 87 mg/dL (ref 0–99)
Triglycerides: 314 mg/dL — ABNORMAL HIGH (ref 0–149)
VLDL Cholesterol Cal: 52 mg/dL — ABNORMAL HIGH (ref 5–40)

## 2019-08-11 LAB — VITAMIN B12: Vitamin B-12: 590 pg/mL (ref 232–1245)

## 2019-08-12 ENCOUNTER — Telehealth: Payer: Self-pay

## 2019-08-12 NOTE — Telephone Encounter (Signed)
Return call to pt - stated she needs GI referral; c/o constipation, abd swollen and having gas. Stated she had talked to Dr Mcarthur Rossetti at last visit about this but was not mentioned in her notes. Stated she's taking OTC meds - stool softener, gas-x and may try Miralax. Also she asked if we had received records from [facility]; told her no after checking w/front office. Also asked about bariatric surgery referral - informed she needs to call [phone].  Also stated she concern about low iron level. She's currently out of town, in [place]. She has an appt already scheduled on 5/6 for abd pain in ACC. Told her I will inform her doctor of her concerns.

## 2019-08-12 NOTE — Telephone Encounter (Signed)
Requesting to speak with a nurse about constipation, please call pt back.

## 2019-08-15 NOTE — Telephone Encounter (Signed)
Will follow up at patient's visit in [facility] on 5/6

## 2019-08-18 ENCOUNTER — Ambulatory Visit: Payer: No Typology Code available for payment source | Admitting: Orthopaedic Surgery

## 2019-08-18 ENCOUNTER — Ambulatory Visit: Payer: No Typology Code available for payment source

## 2019-08-19 ENCOUNTER — Encounter: Payer: Self-pay | Admitting: Internal Medicine

## 2019-08-19 ENCOUNTER — Other Ambulatory Visit: Payer: Self-pay

## 2019-08-19 ENCOUNTER — Ambulatory Visit (INDEPENDENT_AMBULATORY_CARE_PROVIDER_SITE_OTHER): Payer: No Typology Code available for payment source | Admitting: Internal Medicine

## 2019-08-19 DIAGNOSIS — I10 Essential (primary) hypertension: Secondary | ICD-10-CM | POA: Diagnosis not present

## 2019-08-19 DIAGNOSIS — Z79899 Other long term (current) drug therapy: Secondary | ICD-10-CM

## 2019-08-19 DIAGNOSIS — R109 Unspecified abdominal pain: Secondary | ICD-10-CM | POA: Insufficient documentation

## 2019-08-19 DIAGNOSIS — G8929 Other chronic pain: Secondary | ICD-10-CM

## 2019-08-19 DIAGNOSIS — F4310 Post-traumatic stress disorder, unspecified: Secondary | ICD-10-CM

## 2019-08-19 DIAGNOSIS — M25562 Pain in left knee: Secondary | ICD-10-CM

## 2019-08-19 DIAGNOSIS — R1013 Epigastric pain: Secondary | ICD-10-CM

## 2019-08-19 DIAGNOSIS — Z6841 Body Mass Index (BMI) 40.0 and over, adult: Secondary | ICD-10-CM

## 2019-08-19 MED ORDER — HYDROCHLOROTHIAZIDE 25 MG PO TABS: 25.0000 mg | ORAL_TABLET | Freq: Every day | ORAL | 0 refills | Status: DC

## 2019-08-19 MED ORDER — OLMESARTAN MEDOXOMIL 20 MG PO TABS: 20.0000 mg | ORAL_TABLET | Freq: Every day | ORAL | 0 refills | Status: DC

## 2019-08-19 NOTE — Patient Instructions (Addendum)
Ms. Covino,   It was a pleasure seeing you in clinic. Today we discussed:  Abdominal discomfort and constipation: At this time, I would recommend trying Mylanta or Maalox for the gas. I also recommend small, frequent meals to avoid the acid from building up and I advise to avoid spicy food at this time. For your constipation, I would recommend adding Metamucil daily.   Hypertension: I will discontinue your current medications and start on Olmesartan 20mg  daily. I will check your BP and lab work at the next visit.   Weight loss: You would benefit from continued weight loss and can initiate your bariatric surgery process by visiting the following website and attending a weight loss seminar: You can call [phone] once you have completed the seminar.   Knee pain: Please schedule the MRI of your left knee and follow up with Dr. [phone] (orthopedic) at your earliest convenience. [facility]: [phone]  For your anemia: I have ordered IV iron infusions for you. You will be contacted to schedule this. I will await for further records from your PCP's office at this time.   PTSD: I will place a referral to our therapist, [phone]. She will reach out to you over the next few weeks.   Pap smear: We will schedule this for your next visit.   Please contact Phineas Semen if you have any questions or concerns at [phone]  Thank you!

## 2019-08-19 NOTE — Progress Notes (Signed)
   CC: abdominal discomfort and constipation  HPI:  Cathy Chang is a 44 y.o. female with PMHx as listed below presenting with abdominal discomfort and constipation for past few weeks. She notes overall decreased appetite but notes that she has been having abdominal discomfort with increasing gas. She notes being constipated while on vacation in [place] a few weeks ago for which she took Miralax and Dulcolax with improvement and has since been having regular bowel movements, although some liquid stools this morning. Please see problem based charting for A/P of chronic issues.   Past Medical History:  Diagnosis Date  . Anemia   . Hypercholesteremia   . Hypertension    Review of Systems:  Negative except as stated in HPI.   Physical Exam:  Vitals:   08/19/19 1014 08/19/19 1025  BP: (!) 143/69 138/70  Pulse: 70 67  Temp: 97.8 F (36.6 C)   TempSrc: Oral   SpO2: 100%   Weight: (!) 303 lb 4.8 oz (137.6 kg)   Height: 5\' 2"  (1.575 m)    Physical Exam Constitutional:      General: She is not in acute distress.    Appearance: She is obese. She is not diaphoretic.  HENT:     Head: Normocephalic and atraumatic.  Eyes:     General: No scleral icterus.    Extraocular Movements: Extraocular movements intact.  Cardiovascular:     Rate and Rhythm: Normal rate and regular rhythm.     Heart sounds: Normal heart sounds.  Pulmonary:     Effort: Pulmonary effort is normal.     Breath sounds: Normal breath sounds.  Abdominal:     General: Bowel sounds are normal. There is no distension.     Palpations: Abdomen is soft.     Tenderness: There is abdominal tenderness in the epigastric area. There is no guarding or rebound. Negative signs include Murphy's sign.  Skin:    General: Skin is warm and dry.     Capillary Refill: Capillary refill takes less than 2 seconds.  Neurological:     General: No focal deficit present.     Mental Status: She is alert and oriented to person, place, and  time.      Assessment & Plan:   See Encounters Tab for problem based charting.  Patient discussed with Dr. 

## 2019-08-19 NOTE — Addendum Note (Signed)
Addended by: Neomia Dear on: 08/19/2019 07:29 AM   Modules accepted: Orders

## 2019-08-19 NOTE — Assessment & Plan Note (Addendum)
BP 143/69 (repeat 138/70) on triamterene-HCTZ 37.5-25mg  daily. Patient reports concerns regarding her blood pressure medications not working as her blood pressure continues to remain above goal of 120/80 and she does not believe she is urinating very much. She does continue to have trace non-pitting edema of bilateral ankles. No headache, chest pain, vision changes or shortness of breath or focal weakness.  Patient unable to tolerate triamterene at this time. Discussed starting olmesartan with HCTZ for which she is agreeable.  Plan: Olmesartan 20mg  daily HCTZ 25mg  daily  BP check in 4 weeks F/u BMP

## 2019-08-26 ENCOUNTER — Ambulatory Visit (INDEPENDENT_AMBULATORY_CARE_PROVIDER_SITE_OTHER): Payer: No Typology Code available for payment source | Admitting: Orthopaedic Surgery

## 2019-08-26 ENCOUNTER — Other Ambulatory Visit: Payer: Self-pay

## 2019-08-26 DIAGNOSIS — M1712 Unilateral primary osteoarthritis, left knee: Secondary | ICD-10-CM

## 2019-08-26 DIAGNOSIS — Z6841 Body Mass Index (BMI) 40.0 and over, adult: Secondary | ICD-10-CM

## 2019-08-26 MED ORDER — MELOXICAM 7.5 MG PO TABS: 7.5000 mg | ORAL_TABLET | Freq: Two times a day (BID) | ORAL | 2 refills | Status: DC | PRN

## 2019-08-27 DIAGNOSIS — M1712 Unilateral primary osteoarthritis, left knee: Secondary | ICD-10-CM | POA: Insufficient documentation

## 2019-08-27 NOTE — Progress Notes (Signed)
Office Visit Note   Patient: Cathy Chang           Date of Birth: 1975/10/12           MRN: [id number] Visit Date: 08/26/2019              Requested by: Sid Falcon, MD [place],  [place] [address] PCP: Harvie Heck, MD   Assessment & Plan: Visit Diagnoses:  1. Primary osteoarthritis of left knee   2. Body mass index 50.0-59.9, adult ([place])   3. Morbid obesity ([place])     Plan: Impression is chronic left knee pain mild osteoarthritis.  Based on our discussion of treatment options she will work on continuing to lose weight and obtain Voltaren gel and turmeric.  She stated that she is able to take NSAIDs as long as they are not combined with Tylenol therefore I sent a prescription for meloxicam.  We will see her back as needed.  Follow-Up Instructions: Return if symptoms worsen or fail to improve.   Orders:  No orders of the defined types were placed in this encounter.  Meds ordered this encounter  Medications  . meloxicam (MOBIC) 7.5 MG tablet    Sig: Take 1 tablet (7.5 mg total) by mouth 2 (two) times daily as needed for pain.    Dispense:  30 tablet    Refill:  2      Procedures: No procedures performed   Clinical Data: No additional findings.   Subjective: Chief Complaint  Patient presents with  . Left Knee - Pain    Cathy Chang is a 44 year old female with chronic left knee pain for the last 8 months without any injuries.  She endorses cracking and popping with occasional numbness tingling and burning.  She has a constant 8/10 pain.  She has not had any cortisone injections.  She takes Tylenol for the pain.  She denies any giving way or catching.  She endorses worsening swelling towards the end of the day.   Review of Systems  Constitutional: Negative.   HENT: Negative.   Eyes: Negative.   Respiratory: Negative.   Cardiovascular: Negative.   Endocrine: Negative.   Musculoskeletal: Negative.   Neurological: Negative.   Hematological: Negative.     Psychiatric/Behavioral: Negative.   All other systems reviewed and are negative.    Objective: Vital Signs: LMP 07/28/2019   Physical Exam Vitals and nursing note reviewed.  Constitutional:      Appearance: She is well-developed.  HENT:     Head: Normocephalic and atraumatic.  Pulmonary:     Effort: Pulmonary effort is normal.  Abdominal:     Palpations: Abdomen is soft.  Musculoskeletal:     Cervical back: Neck supple.  Skin:    General: Skin is warm.     Capillary Refill: Capillary refill takes less than 2 seconds.  Neurological:     Mental Status: She is alert and oriented to person, place, and time.  Psychiatric:        Behavior: Behavior normal.        Thought Content: Thought content normal.        Judgment: Judgment normal.     Ortho Exam Left knee shows no joint effusion.  Normal range of motion without crepitus.  Collaterals and cruciates are stable.  No bony tenderness. Specialty Comments:  No specialty comments available.  Imaging: No results found.   PMFS History: Patient Active Problem List   Diagnosis Date Noted  . Primary  osteoarthritis of left knee 08/27/2019  . Morbid obesity (HCC) 08/27/2019  . Abdominal pain 08/19/2019  . Hyperlipidemia 08/02/2019  . Body mass index 50.0-59.9, adult (HCC) 08/02/2019  . Hypokalemia 07/20/2019  . Chronic pain of left knee 07/20/2019  . Anemia 07/20/2019  . Current moderate episode of major depressive disorder (HCC) 07/20/2019  . Essential hypertension 07/20/2019   Past Medical History:  Diagnosis Date  . Anemia   . Hypercholesteremia   . Hypertension     Family History  Problem Relation Age of Onset  . Lung cancer Mother   . Diabetes Father   . Hypertension Father   . Lung cancer Maternal Grandmother     Past Surgical History:  Procedure Laterality Date  . CESAREAN SECTION    . CHOLECYSTECTOMY     Social History   Occupational History  . Not on file  Tobacco Use  . Smoking status:  Current Every Day Smoker    Start date: 04/14/1998  . Smokeless tobacco: Never Used  . Tobacco comment: 2 per day   Substance and Sexual Activity  . Alcohol use: Yes    Comment: occasional   . Drug use: Yes    Types: Marijuana    Comment: daily   . Sexual activity: Not on file

## 2019-08-31 DIAGNOSIS — F4310 Post-traumatic stress disorder, unspecified: Secondary | ICD-10-CM | POA: Insufficient documentation

## 2019-08-31 NOTE — Assessment & Plan Note (Signed)
Patient presented with a few weeks of abdominal discomfort and constipation. She notes being constipated while on vacation in [place] a few weeks ago for which she took Miralax and Dulcolax with improvement and has since been having regular bowel movements. Patient notes associated decreased appetite and feeling of abdominal fullness with increasing gas. She is requesting GI referral. She denies any nausea or vomiting or dysphagia. Discussed trial of Mylanta or Maalox for gas. Also advised to avoid spicy food and recommended for frequent small meals.

## 2019-08-31 NOTE — Assessment & Plan Note (Signed)
Patient notes ongoing left knee pain with intermittent numbness/tingling unchanged from prior. She has not been able to schedule her MRI yet and missed recent orthopedic appointment. Suspect that her weight could also be contributing to worsening left knee pain; however, would benefit from orthopedic evaluation and MRI.  Plan: Patient encouraged to reschedule appointment with orthopedics F/u MRI

## 2019-08-31 NOTE — Assessment & Plan Note (Signed)
Patient with history of morbid obesity. She is interested in weight loss and bariatric surgery. Referral provided for bariatric surgery seminar.   Plan: Continue to encourage lifestyle modification  Bariatric surgery referral provided

## 2019-08-31 NOTE — Assessment & Plan Note (Signed)
Patient with traumatic experience involving child's death that she has been trying to cope with. She is using marijuana daily but notes that she continues to experience significant depression. She is interested in counseling.  Will place referral to [facility]

## 2019-09-05 NOTE — Progress Notes (Signed)
Internal Medicine Clinic Attending  Case discussed with Dr. Aslam at the time of the visit.  We reviewed the resident's history and exam and pertinent patient test results.  I agree with the assessment, diagnosis, and plan of care documented in the resident's note.  

## 2019-09-14 ENCOUNTER — Ambulatory Visit (HOSPITAL_COMMUNITY): Payer: No Typology Code available for payment source

## 2019-09-20 ENCOUNTER — Encounter: Payer: Self-pay | Admitting: Licensed Clinical Social Worker

## 2019-09-20 ENCOUNTER — Telehealth: Payer: Self-pay | Admitting: Licensed Clinical Social Worker

## 2019-09-20 NOTE — Telephone Encounter (Signed)
Patient was called to discuss the referral for services. Patient reported that she did not think our office was "equipped to handle her mental health". Patient was mailed a list of resources so that she can call and select her own provider per in insurance.

## 2019-09-26 ENCOUNTER — Ambulatory Visit (HOSPITAL_COMMUNITY): Payer: No Typology Code available for payment source

## 2019-09-27 ENCOUNTER — Telehealth: Payer: Self-pay | Admitting: *Deleted

## 2019-09-27 ENCOUNTER — Ambulatory Visit: Payer: No Typology Code available for payment source

## 2019-09-27 NOTE — Telephone Encounter (Signed)
All to patient concerning missed appointment.  Message left on home number about missed appointment today.  Angelina Ok, RN 09/27/2019 10:09 AM.

## 2019-09-28 ENCOUNTER — Other Ambulatory Visit (HOSPITAL_COMMUNITY)
Admission: RE | Admit: 2019-09-28 | Discharge: 2019-09-28 | Disposition: A | Discharge status: Home / Self Care | Payer: No Typology Code available for payment source | Source: Ambulatory Visit | Attending: Student in an Organized Health Care Education/Training Program | Admitting: Student in an Organized Health Care Education/Training Program

## 2019-09-28 ENCOUNTER — Encounter: Payer: Self-pay | Admitting: Internal Medicine

## 2019-09-28 ENCOUNTER — Other Ambulatory Visit: Payer: Self-pay

## 2019-09-28 ENCOUNTER — Ambulatory Visit (INDEPENDENT_AMBULATORY_CARE_PROVIDER_SITE_OTHER): Payer: No Typology Code available for payment source | Admitting: Internal Medicine

## 2019-09-28 VITALS — BP 140/99 | HR 83 | Temp 98.0°F | Ht 62.0 in | Wt 301.2 lb

## 2019-09-28 DIAGNOSIS — D509 Iron deficiency anemia, unspecified: Secondary | ICD-10-CM

## 2019-09-28 DIAGNOSIS — N898 Other specified noninflammatory disorders of vagina: Secondary | ICD-10-CM | POA: Insufficient documentation

## 2019-09-28 DIAGNOSIS — Z124 Encounter for screening for malignant neoplasm of cervix: Secondary | ICD-10-CM | POA: Diagnosis not present

## 2019-09-28 LAB — POCT URINALYSIS DIPSTICK
Glucose, UA: NEGATIVE
Ketones, UA: NEGATIVE
Leukocytes, UA: NEGATIVE
Nitrite, UA: NEGATIVE
Protein, UA: POSITIVE — AB
Spec Grav, UA: 1.02 (ref 1.010–1.025)
Urobilinogen, UA: 1 E.U./dL
pH, UA: 7.5 (ref 5.0–8.0)

## 2019-09-28 LAB — POCT URINE PREGNANCY: Preg Test, Ur: NEGATIVE

## 2019-09-28 NOTE — Assessment & Plan Note (Signed)
Ms. Guin states her last pap smear was at least 3-4 years ago.   Assessment/Plan: Pap smear attempted today, however cervix unable to be visualized.   - Referral to gynecology for pap smear

## 2019-09-28 NOTE — Assessment & Plan Note (Signed)
Cathy Chang states she has noticed increased ice cravings in the past several weeks, that makes her concerned for worsening in her IDA. She says in the past when she craved ice, usually her hemoglobin was lower than usual. She endorses chronic unchanged dizziness, but denies SOB. Denies any recent vaginal bleeding.   Assessment and Plan:  - CBC ordered today - Follow up with PCP to arrange iron infusions

## 2019-09-28 NOTE — Progress Notes (Signed)
   CC: Vaginal discharge  HPI:  Cathy Chang is a 44 y.o. with a PMHx as listed below who presents to the clinic for vaginal discharge.   Please see the Encounters tab for problem-based Assessment & Plan regarding status of patient's conditions.  Past Medical History:  Diagnosis Date  . Anemia   . Hypercholesteremia   . Hypertension    Review of Systems: Review of Systems  Constitutional: Negative for chills and fever.  Respiratory: Negative for shortness of breath.   Gastrointestinal: Positive for abdominal pain, constipation and diarrhea. Negative for blood in stool and melena.  Genitourinary: Positive for dysuria. Negative for flank pain and hematuria.       + vaginal discharge - vaginal bleeding  Neurological: Positive for dizziness (chronic).   Physical Exam:  Vitals:   09/28/19 0910  BP: (!) 140/99  Pulse: 83  Temp: 98 F (36.7 C)  TempSrc: Oral  SpO2: 100%  Weight: (!) 301 lb 3.2 oz (136.6 kg)  Height: 5\' 2"  (1.575 m)   Physical Exam Vitals and nursing note reviewed. Exam conducted with a chaperone present.  Constitutional:      General: She is not in acute distress.    Appearance: She is obese.  Genitourinary:    General: Normal vulva.     Exam position: Lithotomy position.     Pubic Area: No rash.      Labia:        Right: No rash or lesion.        Left: No rash or lesion.      Vagina: No foreign body. Vaginal discharge (white thin discharge present - moderate quantity) and tenderness present. No erythema, bleeding or lesions.     Comments: Cervix unable to be visualized.  Skin:    General: Skin is warm and dry.  Neurological:     General: No focal deficit present.     Mental Status: She is alert and oriented to person, place, and time. Mental status is at baseline.  Psychiatric:        Mood and Affect: Mood normal.        Behavior: Behavior normal.    Assessment & Plan:   See Encounters Tab for problem based charting.  Patient seen with  Dr. 

## 2019-09-28 NOTE — Patient Instructions (Addendum)
It was nice seeing you today! Thank you for choosing [facility] for your Primary Care.    Today we talked about:   1. Vaginal Concerns: I will contact you with the results. If you require any medications based on the results, I will send them into the pharmacy.   2. Low blood counts: I will check your blood counts today. Please follow up with your primary care doctor to set up the iron infusions.

## 2019-09-28 NOTE — Assessment & Plan Note (Addendum)
Cathy Chang states that she had unprotected sexual intercourse several weeks ago, after which she developed significant vaginal itching with heavy yellow-brown discharge. She went to a clinic to be evaluated for STIs and was told she is positive for trichomonas and bacterial vaginosis. At the time, she received a one time dose of Metronidazole. Since then, she continues to have severe discomfort with continued vaginal itching. She also endorses continued vaginal discharge that is yellow-brown with a slightly foul odor. She denies any additional sexual interactions. She endorses mild dysuria as well since onset of vaginal discharge. She states she has missed her last menstrual cycle which is very unusual for her. She notes a history of tubal ligation though.   Assessment/Plan:  Unfortunately I am unable to see records from work up 2-3 weeks ago. Given her continued symptoms, will recheck for STIs. If trichomonas has persisted, will need a 7 day course of Metronidazole. UA negative for leukocytes and nitrates; UTI less likely. Pregnancy test negative.   - Swab for G/C, trich, candida, gardnerella - HIV, RPR  - UA with urine pregnancy test completed

## 2019-09-29 ENCOUNTER — Other Ambulatory Visit: Payer: Self-pay | Admitting: Internal Medicine

## 2019-09-29 DIAGNOSIS — A599 Trichomoniasis, unspecified: Secondary | ICD-10-CM | POA: Insufficient documentation

## 2019-09-29 LAB — CBC
Hematocrit: 31.9 % — ABNORMAL LOW (ref 34.0–46.6)
Hemoglobin: 9.8 g/dL — ABNORMAL LOW (ref 11.1–15.9)
MCH: 25 pg — ABNORMAL LOW (ref 26.6–33.0)
MCHC: 30.7 g/dL — ABNORMAL LOW (ref 31.5–35.7)
MCV: 81 fL (ref 79–97)
Platelets: 458 10*3/uL — ABNORMAL HIGH (ref 150–450)
RBC: 3.92 x10E6/uL (ref 3.77–5.28)
RDW: 16.4 % — ABNORMAL HIGH (ref 11.7–15.4)
WBC: 9.8 10*3/uL (ref 3.4–10.8)

## 2019-09-29 LAB — CERVICOVAGINAL ANCILLARY ONLY
Bacterial Vaginitis (gardnerella): POSITIVE — AB
Candida Glabrata: NEGATIVE
Candida Vaginitis: NEGATIVE
Chlamydia: NEGATIVE
Comment: NEGATIVE
Comment: NEGATIVE
Comment: NEGATIVE
Comment: NEGATIVE
Comment: NEGATIVE
Comment: NORMAL
Neisseria Gonorrhea: NEGATIVE
Trichomonas: POSITIVE — AB

## 2019-09-29 LAB — RPR: RPR Ser Ql: NONREACTIVE

## 2019-09-29 LAB — HIV ANTIBODY (ROUTINE TESTING W REFLEX): HIV Screen 4th Generation wRfx: NONREACTIVE

## 2019-09-29 MED ORDER — METRONIDAZOLE 500 MG PO TABS: 500.0000 mg | ORAL_TABLET | Freq: Two times a day (BID) | ORAL | 0 refills | Status: AC

## 2019-09-29 NOTE — Addendum Note (Signed)
Addended by: Erlinda Hong T on: 09/29/2019 11:34 AM   Modules accepted: Level of Service

## 2019-09-29 NOTE — Assessment & Plan Note (Signed)
Repeat swab positive for Trichomonas. 7 days of Metronidazole 500 mg BID sent to pharmacy. Please see office visit note from 09/28/2019 for more information

## 2019-09-29 NOTE — Progress Notes (Signed)
Internal Medicine Clinic Attending  I saw and evaluated the patient.  I personally confirmed the key portions of the history and exam documented by Dr. Basaraba and I reviewed pertinent patient test results.  The assessment, diagnosis, and plan were formulated together and I agree with the documentation in the resident's note.    

## 2019-09-30 ENCOUNTER — Telehealth: Payer: Self-pay | Admitting: Internal Medicine

## 2019-09-30 NOTE — Telephone Encounter (Signed)
Patient records from her PCP in [place] have been reviewed. Patient was unable to tolerate PO iron tablets or liquid and was started on IV iron infusions for iron deficiency anemia. Orders placed for patient to receive IV iron infusion for her iron deficiency anemia initially by Dr. Chesley Mires on 07/21/2019 and again on 08/02/2019 by myself. This was to be scheduled through short stay. However, patient's insurance is via [facility] plan which is only preventative for 2 visits and does not cover any Outpatient or Inpatient procedures. Also the patient has an out of state Medicaid Plan which usually does not cover this. Unclear if patient has been able to change her Medicaid to [place] yet.  Our office has been trying to contact patient to relay this information and that she may have to pay out of pocket for her iron infusions. However, have not been able to get in touch with her. I also attempted to reach patient this afternoon; however, unable to talk to patient and voicemail is currently full. Will attempt to reach patient again to discuss this.

## 2019-09-30 NOTE — Telephone Encounter (Signed)
Patient requested phone call this afternoon to inform that she found the name of her previous iron infusions. She states she would receive 200 mg of IV Venofer, with 120 mg of Methylprednisone available in case of allergic reaction. She denies any allergic reactions to the IV infusions in the most recent sessions though.   She notes that her continues to have fatigue and generalized weakness since her hemoglobin has continued to decrease. She is requesting orders for IV infusions. This is something her PCP has been working on already; so will relay the message.

## 2019-10-02 ENCOUNTER — Other Ambulatory Visit: Payer: Self-pay

## 2019-10-02 ENCOUNTER — Encounter (HOSPITAL_COMMUNITY): Payer: Self-pay | Admitting: Emergency Medicine

## 2019-10-02 ENCOUNTER — Emergency Department (HOSPITAL_COMMUNITY): Payer: No Typology Code available for payment source

## 2019-10-02 ENCOUNTER — Emergency Department (HOSPITAL_COMMUNITY)
Admission: EM | Admit: 2019-10-02 | Discharge: 2019-10-03 | Disposition: A | Discharge status: Home / Self Care | Payer: No Typology Code available for payment source | Attending: Emergency Medicine | Admitting: Emergency Medicine

## 2019-10-02 ENCOUNTER — Telehealth: Payer: Self-pay | Admitting: Internal Medicine

## 2019-10-02 DIAGNOSIS — Z5321 Procedure and treatment not carried out due to patient leaving prior to being seen by health care provider: Secondary | ICD-10-CM | POA: Insufficient documentation

## 2019-10-02 DIAGNOSIS — R0602 Shortness of breath: Secondary | ICD-10-CM | POA: Diagnosis not present

## 2019-10-02 LAB — BASIC METABOLIC PANEL
Anion gap: 10 (ref 5–15)
BUN: 16 mg/dL (ref 6–20)
CO2: 27 mmol/L (ref 22–32)
Calcium: 8.8 mg/dL — ABNORMAL LOW (ref 8.9–10.3)
Chloride: 100 mmol/L (ref 98–111)
Creatinine, Ser: 1.2 mg/dL — ABNORMAL HIGH (ref 0.44–1.00)
GFR calc Af Amer: >60 mL/min (ref >60)
GFR calc non Af Amer: 55 mL/min — ABNORMAL LOW (ref >60)
Glucose, Bld: 166 mg/dL — ABNORMAL HIGH (ref 70–99)
Potassium: 3 mmol/L — ABNORMAL LOW (ref 3.5–5.1)
Sodium: 137 mmol/L (ref 135–145)

## 2019-10-02 LAB — CBC
HCT: 32.6 % — ABNORMAL LOW (ref 36.0–46.0)
Hemoglobin: 9.6 g/dL — ABNORMAL LOW (ref 12.0–15.0)
MCH: 25.5 pg — ABNORMAL LOW (ref 26.0–34.0)
MCHC: 29.4 g/dL — ABNORMAL LOW (ref 30.0–36.0)
MCV: 86.5 fL (ref 80.0–100.0)
Platelets: 445 10*3/uL — ABNORMAL HIGH (ref 150–400)
RBC: 3.77 MIL/uL — ABNORMAL LOW (ref 3.87–5.11)
RDW: 16.7 % — ABNORMAL HIGH (ref 11.5–15.5)
WBC: 11.2 10*3/uL — ABNORMAL HIGH (ref 4.0–10.5)
nRBC: 0 % (ref 0.0–0.2)

## 2019-10-02 LAB — TROPONIN I (HIGH SENSITIVITY): Troponin I (High Sensitivity): 6 ng/L (ref <18)

## 2019-10-02 MED ORDER — SODIUM CHLORIDE 0.9% FLUSH: 3.0000 mL | Freq: Once | INTRAVENOUS | Status: DC

## 2019-10-02 NOTE — ED Triage Notes (Signed)
Patient reports left chest pain onset yesterday with SOB , denies emesis or diaphoresis , no cough or fever .

## 2019-10-02 NOTE — Telephone Encounter (Signed)
° °  Reason for call:   I received a call from [name] at 12 PM indicating question about her lab results.   Pertinent Data:   [name] called the on-call line for internal medicine clinic with question regarding her hemoglobin. She mentions that she was notified that her hemoglobin is 9.8 and mentions that she has noted generalized weakness. She mentions that she was informed that she needs IV iron infusions but she has not yet been scheduled. She is asking if she needs to go to the ED. She also mentions that she has not yet had her period and wonders if her iron deficiency is contributing.   Assessment / Plan / Recommendations:   A/P  Iron Deficiency Anemia: Chart review shows low ferritin and previous attempts by PCP and [facility] providers to arrange for IV iron transfusion. Complicated by difficulty in coverage from her insurance company. She mentions that she is not using her Medicaid and using a private insurance called '[employer].' Discussed that [employer] insurance is not listed on her chart and advised to reach out to her insurance company to provide them with [facility] information and advised to confirm that they would cover short stay iron infusion. Also reassured [name] that she does not need to go to the ED for hemoglobin of 9.8. [name] expressed understanding and states she will follow up with her insurance company.    As always, pt is advised that if symptoms worsen or new symptoms arise, they should go to an urgent care facility or to to ER for further evaluation.   Theotis Barrio, MD   10/02/2019, 12:52 PM

## 2019-10-03 ENCOUNTER — Telehealth: Payer: Self-pay | Admitting: *Deleted

## 2019-10-03 NOTE — Telephone Encounter (Signed)
Dr Mcarthur Rossetti could you please call pt at 1500 today, she is calling about her iron infusions and is upset. Stating she has never had medicaid that she has insurance.

## 2019-10-03 NOTE — ED Notes (Signed)
Pt states she is leaving  

## 2019-10-03 NOTE — Telephone Encounter (Signed)
Called patient regarding insurance and IV iron infusions. Discussed that patient's IV iron infusions were ordered by Dr. Chesley Mires initially on her initial visit 07/21/2019 and again by myself on 08/02/2019. Patient reports that she has Medicaid in [place] but also has insurance through [employer] in [place]. Patient notes that she had to switch insurance in May 2021 and currently has [facility]. Patient requesting me to follow up with her insurance. Called the insurance company and inquired about coverage for her IV iron infusions. Insurance company representative reports that patient has preventative care plan that does not cover IV iron infusions. Discussed this with patient and advised her to call the insurance company for further information.  Patient would like to follow up on this tomorrow. Will call back.

## 2019-10-04 ENCOUNTER — Telehealth: Payer: Self-pay | Admitting: Internal Medicine

## 2019-10-04 NOTE — Telephone Encounter (Signed)
Pt is calling back in reference to her IV Iron Infusion. Pt requesting a call back.

## 2019-10-05 NOTE — Telephone Encounter (Signed)
Pt calling back to discuss her Iron Infusions.

## 2019-10-05 NOTE — Telephone Encounter (Signed)
Attempted to call patient. No answer. Left voicemail. Will try again later today.

## 2019-10-10 ENCOUNTER — Encounter: Payer: No Typology Code available for payment source | Admitting: Internal Medicine

## 2019-10-11 ENCOUNTER — Inpatient Hospital Stay (HOSPITAL_COMMUNITY): Admission: RE | Admit: 2019-10-11 | Payer: No Typology Code available for payment source | Source: Ambulatory Visit

## 2019-10-12 ENCOUNTER — Telehealth: Payer: Self-pay | Admitting: Internal Medicine

## 2019-10-12 NOTE — Telephone Encounter (Signed)
Patient is sch for her Iron Infusion on 10/18/2019 with the short stay Center.  Patient states she has contacted her insurance and she will have to pay out of pocket and needs to know what codes are being used for pricing.  Please call this patient back as she is very upset and states she has not been called back.

## 2019-10-12 NOTE — Telephone Encounter (Signed)
Attempted to call patient x2 regarding ICD codes. Unable to reach patient at this time and voicemail is full.

## 2019-10-14 ENCOUNTER — Telehealth: Payer: Self-pay | Admitting: *Deleted

## 2019-10-14 NOTE — Telephone Encounter (Signed)
Dr. Mcarthur Rossetti please call patient back today.

## 2019-10-14 NOTE — Discharge Instructions (Signed)

## 2019-10-14 NOTE — Telephone Encounter (Signed)
Returned call to patient.  Patient is very upset regarding her IV iron infusions.  She expresses frustration regarding setting up her infusions.  Discussed with patient that her IV iron infusions were ordered initially by Dr. Chesley Mires when she initially presented to our clinic to establish care, and again by myself on my initial visit with her.  However, we have been awaiting insurance authorization and approval for these as she initially had out-of-state Medicaid and then private insurance.  Patient expresses frustration with talking to her insurance company, preservices, and our clinic to coordinate her care.  Patient is requesting her medical records and would like to transfer her care to elsewhere at this time.

## 2019-10-15 ENCOUNTER — Ambulatory Visit (HOSPITAL_COMMUNITY)
Admission: RE | Admit: 2019-10-15 | Discharge: 2019-10-15 | Disposition: A | Discharge status: Home / Self Care | Payer: No Typology Code available for payment source | Source: Ambulatory Visit | Attending: Internal Medicine | Admitting: Internal Medicine

## 2019-10-15 ENCOUNTER — Ambulatory Visit (HOSPITAL_COMMUNITY): Payer: Medicaid - Out of State

## 2019-10-15 ENCOUNTER — Other Ambulatory Visit: Payer: Self-pay

## 2019-10-15 DIAGNOSIS — G8929 Other chronic pain: Secondary | ICD-10-CM | POA: Insufficient documentation

## 2019-10-15 DIAGNOSIS — M25562 Pain in left knee: Secondary | ICD-10-CM | POA: Insufficient documentation

## 2019-10-18 ENCOUNTER — Ambulatory Visit (HOSPITAL_COMMUNITY)
Admission: RE | Admit: 2019-10-18 | Discharge: 2019-10-18 | Disposition: A | Discharge status: Home / Self Care | Payer: No Typology Code available for payment source | Source: Ambulatory Visit | Attending: Internal Medicine | Admitting: Internal Medicine

## 2019-10-18 ENCOUNTER — Other Ambulatory Visit: Payer: Self-pay

## 2019-10-18 ENCOUNTER — Encounter: Payer: No Typology Code available for payment source | Admitting: Internal Medicine

## 2019-10-18 DIAGNOSIS — D500 Iron deficiency anemia secondary to blood loss (chronic): Secondary | ICD-10-CM | POA: Insufficient documentation

## 2019-10-18 MED ORDER — SODIUM CHLORIDE 0.9 % IV SOLN
200.0000 mg | INTRAVENOUS | Status: DC
  Administered 2019-10-18: 200 mg via INTRAVENOUS
  Filled 2019-10-18: qty 10

## 2019-10-19 ENCOUNTER — Other Ambulatory Visit: Payer: Self-pay | Admitting: Internal Medicine

## 2019-10-25 ENCOUNTER — Ambulatory Visit (HOSPITAL_COMMUNITY)
Admission: RE | Admit: 2019-10-25 | Discharge: 2019-10-25 | Disposition: A | Discharge status: Home / Self Care | Payer: No Typology Code available for payment source | Source: Ambulatory Visit

## 2019-10-25 ENCOUNTER — Other Ambulatory Visit: Payer: Self-pay

## 2019-10-25 DIAGNOSIS — D500 Iron deficiency anemia secondary to blood loss (chronic): Secondary | ICD-10-CM

## 2019-10-25 MED ORDER — SODIUM CHLORIDE 0.9 % IV SOLN
200.0000 mg | INTRAVENOUS | Status: DC
  Administered 2019-10-25: 200 mg via INTRAVENOUS
  Filled 2019-10-25: qty 10

## 2019-11-01 ENCOUNTER — Other Ambulatory Visit: Payer: Self-pay

## 2019-11-01 ENCOUNTER — Ambulatory Visit (HOSPITAL_COMMUNITY)
Admission: RE | Admit: 2019-11-01 | Discharge: 2019-11-01 | Disposition: A | Discharge status: Home / Self Care | Payer: No Typology Code available for payment source | Source: Ambulatory Visit | Attending: Internal Medicine | Admitting: Internal Medicine

## 2019-11-01 DIAGNOSIS — D500 Iron deficiency anemia secondary to blood loss (chronic): Secondary | ICD-10-CM

## 2019-11-01 MED ORDER — SODIUM CHLORIDE 0.9 % IV SOLN
200.0000 mg | INTRAVENOUS | Status: DC
  Administered 2019-11-01: 200 mg via INTRAVENOUS
  Filled 2019-11-01: qty 10

## 2019-11-07 ENCOUNTER — Other Ambulatory Visit: Payer: Self-pay | Admitting: Internal Medicine

## 2019-11-08 ENCOUNTER — Inpatient Hospital Stay (HOSPITAL_COMMUNITY): Admission: RE | Admit: 2019-11-08 | Payer: No Typology Code available for payment source | Source: Ambulatory Visit

## 2019-12-05 ENCOUNTER — Encounter: Payer: No Typology Code available for payment source | Admitting: Family Medicine

## 2019-12-06 NOTE — Addendum Note (Signed)
Addended by: Neomia Dear on: 12/06/2019 08:58 AM   Modules accepted: Orders

## 2020-01-03 ENCOUNTER — Ambulatory Visit: Payer: No Typology Code available for payment source | Admitting: Family

## 2020-01-03 DIAGNOSIS — Z0289 Encounter for other administrative examinations: Secondary | ICD-10-CM

## 2020-05-23 ENCOUNTER — Ambulatory Visit: Payer: No Typology Code available for payment source | Attending: Family Medicine | Admitting: Family Medicine

## 2020-05-23 ENCOUNTER — Other Ambulatory Visit: Payer: Self-pay

## 2020-05-23 ENCOUNTER — Encounter: Payer: Self-pay | Admitting: Family Medicine

## 2020-05-23 VITALS — BP 118/79 | HR 78 | Ht 62.0 in | Wt 294.4 lb

## 2020-05-23 DIAGNOSIS — E569 Vitamin deficiency, unspecified: Secondary | ICD-10-CM

## 2020-05-23 DIAGNOSIS — D500 Iron deficiency anemia secondary to blood loss (chronic): Secondary | ICD-10-CM

## 2020-05-23 DIAGNOSIS — N920 Excessive and frequent menstruation with regular cycle: Secondary | ICD-10-CM

## 2020-05-23 DIAGNOSIS — I10 Essential (primary) hypertension: Secondary | ICD-10-CM

## 2020-05-23 DIAGNOSIS — Z6841 Body Mass Index (BMI) 40.0 and over, adult: Secondary | ICD-10-CM

## 2020-05-23 MED ORDER — LOSARTAN POTASSIUM 25 MG PO TABS: 25.0000 mg | ORAL_TABLET | Freq: Every day | ORAL | 1 refills | Status: AC

## 2020-05-23 MED ORDER — TRIAMTERENE-HCTZ 37.5-25 MG PO TABS: 1.0000 | ORAL_TABLET | Freq: Every day | ORAL | 1 refills | Status: AC

## 2020-05-23 NOTE — Patient Instructions (Signed)
Calorie Counting for Weight Loss Calories are units of energy. Your body needs a certain number of calories from food to keep going throughout the day. When you eat or drink more calories than your body needs, your body stores the extra calories mostly as fat. When you eat or drink fewer calories than your body needs, your body burns fat to get the energy it needs. Calorie counting means keeping track of how many calories you eat and drink each day. Calorie counting can be helpful if you need to lose weight. If you eat fewer calories than your body needs, you should lose weight. Ask your health care provider what a healthy weight is for you. For calorie counting to work, you will need to eat the right number of calories each day to lose a healthy amount of weight per week. A dietitian can help you figure out how many calories you need in a day and will suggest ways to reach your calorie goal.  A healthy amount of weight to lose each week is usually 1-2 lb (0.5-0.9 kg). This usually means that your daily calorie intake should be reduced by 500-750 calories.  Eating 1,200-1,500 calories a day can help most women lose weight.  Eating 1,500-1,800 calories a day can help most men lose weight. What do I need to know about calorie counting? Work with your health care provider or dietitian to determine how many calories you should get each day. To meet your daily calorie goal, you will need to:  Find out how many calories are in each food that you would like to eat. Try to do this before you eat.  Decide how much of the food you plan to eat.  Keep a food log. Do this by writing down what you ate and how many calories it had. To successfully lose weight, it is important to balance calorie counting with a healthy lifestyle that includes regular activity. Where do I find calorie information? The number of calories in a food can be found on a Nutrition Facts label. If a food does not have a Nutrition Facts  label, try to look up the calories online or ask your dietitian for help. Remember that calories are listed per serving. If you choose to have more than one serving of a food, you will have to multiply the calories per serving by the number of servings you plan to eat. For example, the label on a package of bread might say that a serving size is 1 slice and that there are 90 calories in a serving. If you eat 1 slice, you will have eaten 90 calories. If you eat 2 slices, you will have eaten 180 calories.   How do I keep a food log? After each time that you eat, record the following in your food log as soon as possible:  What you ate. Be sure to include toppings, sauces, and other extras on the food.  How much you ate. This can be measured in cups, ounces, or number of items.  How many calories were in each food and drink.  The total number of calories in the food you ate. Keep your food log near you, such as in a pocket-sized notebook or on an app or website on your mobile phone. Some programs will calculate calories for you and show you how many calories you have left to meet your daily goal. What are some portion-control tips?  Know how many calories are in a serving. This will   help you know how many servings you can have of a certain food.  Use a measuring cup to measure serving sizes. You could also try weighing out portions on a kitchen scale. With time, you will be able to estimate serving sizes for some foods.  Take time to put servings of different foods on your favorite plates or in your favorite bowls and cups so you know what a serving looks like.  Try not to eat straight from a food's packaging, such as from a bag or box. Eating straight from the package makes it hard to see how much you are eating and can lead to overeating. Put the amount you would like to eat in a cup or on a plate to make sure you are eating the right portion.  Use smaller plates, glasses, and bowls for smaller  portions and to prevent overeating.  Try not to multitask. For example, avoid watching TV or using your computer while eating. If it is time to eat, sit down at a table and enjoy your food. This will help you recognize when you are full. It will also help you be more mindful of what and how much you are eating. What are tips for following this plan? Reading food labels  Check the calorie count compared with the serving size. The serving size may be smaller than what you are used to eating.  Check the source of the calories. Try to choose foods that are high in protein, fiber, and vitamins, and low in saturated fat, trans fat, and sodium. Shopping  Read nutrition labels while you shop. This will help you make healthy decisions about which foods to buy.  Pay attention to nutrition labels for low-fat or fat-free foods. These foods sometimes have the same number of calories or more calories than the full-fat versions. They also often have added sugar, starch, or salt to make up for flavor that was removed with the fat.  Make a grocery list of lower-calorie foods and stick to it. Cooking  Try to cook your favorite foods in a healthier way. For example, try baking instead of frying.  Use low-fat dairy products. Meal planning  Use more fruits and vegetables. One-half of your plate should be fruits and vegetables.  Include lean proteins, such as chicken, turkey, and fish. Lifestyle Each week, aim to do one of the following:  150 minutes of moderate exercise, such as walking.  75 minutes of vigorous exercise, such as running. General information  Know how many calories are in the foods you eat most often. This will help you calculate calorie counts faster.  Find a way of tracking calories that works for you. Get creative. Try different apps or programs if writing down calories does not work for you. What foods should I eat?  Eat nutritious foods. It is better to have a nutritious,  high-calorie food, such as an avocado, than a food with few nutrients, such as a bag of potato chips.  Use your calories on foods and drinks that will fill you up and will not leave you hungry soon after eating. ? Examples of foods that fill you up are nuts and nut butters, vegetables, lean proteins, and high-fiber foods such as whole grains. High-fiber foods are foods with more than 5 g of fiber per serving.  Pay attention to calories in drinks. Low-calorie drinks include water and unsweetened drinks. The items listed above may not be a complete list of foods and beverages you can eat.   Contact a dietitian for more information.   What foods should I limit? Limit foods or drinks that are not good sources of vitamins, minerals, or protein or that are high in unhealthy fats. These include:  Candy.  Other sweets.  Sodas, specialty coffee drinks, alcohol, and juice. The items listed above may not be a complete list of foods and beverages you should avoid. Contact a dietitian for more information. How do I count calories when eating out?  Pay attention to portions. Often, portions are much larger when eating out. Try these tips to keep portions smaller: ? Consider sharing a meal instead of getting your own. ? If you get your own meal, eat only half of it. Before you start eating, ask for a container and put half of your meal into it. ? When available, consider ordering smaller portions from the menu instead of full portions.  Pay attention to your food and drink choices. Knowing the way food is cooked and what is included with the meal can help you eat fewer calories. ? If calories are listed on the menu, choose the lower-calorie options. ? Choose dishes that include vegetables, fruits, whole grains, low-fat dairy products, and lean proteins. ? Choose items that are boiled, broiled, grilled, or steamed. Avoid items that are buttered, battered, fried, or served with cream sauce. Items labeled as  crispy are usually fried, unless stated otherwise. ? Choose water, low-fat milk, unsweetened iced tea, or other drinks without added sugar. If you want an alcoholic beverage, choose a lower-calorie option, such as a glass of wine or light beer. ? Ask for dressings, sauces, and syrups on the side. These are usually high in calories, so you should limit the amount you eat. ? If you want a salad, choose a garden salad and ask for grilled meats. Avoid extra toppings such as bacon, cheese, or fried items. Ask for the dressing on the side, or ask for olive oil and vinegar or lemon to use as dressing.  Estimate how many servings of a food you are given. Knowing serving sizes will help you be aware of how much food you are eating at restaurants. Where to find more information  Centers for Disease Control and Prevention: www.cdc.gov  [place] Department of Agriculture: myplate.gov Summary  Calorie counting means keeping track of how many calories you eat and drink each day. If you eat fewer calories than your body needs, you should lose weight.  A healthy amount of weight to lose per week is usually 1-2 lb (0.5-0.9 kg). This usually means reducing your daily calorie intake by 500-750 calories.  The number of calories in a food can be found on a Nutrition Facts label. If a food does not have a Nutrition Facts label, try to look up the calories online or ask your dietitian for help.  Use smaller plates, glasses, and bowls for smaller portions and to prevent overeating.  Use your calories on foods and drinks that will fill you up and not leave you hungry shortly after a meal. This information is not intended to replace advice given to you by your health care provider. Make sure you discuss any questions you have with your health care provider. Document Revised: 05/12/2019 Document Reviewed: 05/12/2019 Elsevier Patient Education  2021 [employer]  

## 2020-05-23 NOTE — Progress Notes (Signed)
Subjective:  Patient ID: Cathy Chang, female    DOB: 1976/01/18  Age: 45 y.o. MRN: [id number]  CC: New Patient (Initial Visit)   HPI Cathy Chang is a 45 year old female with a history of Hypertension, Anemia who presents today to establish care. Relocated from [place] 1 year ago.  She has had menorrhagia which is chronic but as she gets older she is having blood clots with resulting anemia with indication for transfusion with Cathy Chang.  While in [place] she received this transfusion twice a year but has received this once since she came to [place]. Periods last 7-10 days every month She is unable to tolerate iron pills as 'it does not absorb in my stomach'.  She states her belly sits heavy on her bladder and she has urinary frequency and incontinence has resolved.  Would like to discuss options for weight management.  Past Medical History:  Diagnosis Date  . Anemia   . Hypercholesteremia   . Hypertension     Past Surgical History:  Procedure Laterality Date  . CESAREAN SECTION    . CHOLECYSTECTOMY      Family History  Problem Relation Age of Onset  . Lung cancer Mother   . Diabetes Father   . Hypertension Father   . Lung cancer Maternal Grandmother     Allergies  Allergen Reactions  . Nsaids Anaphylaxis    Tongue swelling  . Eggs Or Egg-Derived Products   . Lisinopril   . Shellfish Allergy     Outpatient Medications Prior to Visit  Medication Sig Dispense Refill  . cholecalciferol (VITAMIN D3) 25 MCG (1000 UNIT) tablet Take 1,000 Units by mouth daily.    . [facility] VITAMIN B12 1000 MCG TBCR Take 1 tablet by mouth daily.    Marland Kitchen losartan (COZAAR) 25 MG tablet Take 25 mg by mouth daily.    Marland Kitchen triamterene-hydrochlorothiazide (MAXZIDE-25) 37.5-25 MG tablet Take 1 tablet by mouth daily.    . diclofenac Sodium (VOLTAREN) 1 % GEL Apply 4 g topically 4 (four) times daily. (Patient not taking: Reported on 05/23/2020) 350 g 1  . EPINEPHrine 0.3 mg/0.3 mL IJ SOAJ injection FOR USE IN  CASE OF SEVERE ALLERGIC REACTION, INJECT AND CALL 911 (Patient not taking: Reported on 05/23/2020)    . pravastatin (PRAVACHOL) 40 MG tablet Take 40 mg by mouth at bedtime. (Patient not taking: Reported on 05/23/2020)    . hydrochlorothiazide (HYDRODIURIL) 25 MG tablet Take 1 tablet (25 mg total) by mouth daily. 30 tablet 0  . meloxicam (MOBIC) 7.5 MG tablet Take 1 tablet (7.5 mg total) by mouth 2 (two) times daily as needed for pain. (Patient not taking: Reported on 05/23/2020) 30 tablet 2  . olmesartan (BENICAR) 20 MG tablet Take 1 tablet (20 mg total) by mouth daily. 30 tablet 0   No facility-administered medications prior to visit.     ROS Review of Systems  Constitutional: Negative for activity change, appetite change and fatigue.  HENT: Negative for congestion, sinus pressure and sore throat.   Eyes: Negative for visual disturbance.  Respiratory: Negative for cough, chest tightness, shortness of breath and wheezing.   Cardiovascular: Negative for chest pain and palpitations.  Gastrointestinal: Negative for abdominal distention, abdominal pain and constipation.  Endocrine: Negative for polydipsia.  Genitourinary: Positive for menstrual problem. Negative for dysuria and frequency.  Musculoskeletal: Negative for arthralgias and back pain.  Skin: Negative for rash.  Neurological: Positive for light-headedness. Negative for tremors and numbness.  Hematological: Does not bruise/bleed easily.  Psychiatric/Behavioral:  Negative for agitation and behavioral problems.    Objective:  BP 118/79   Pulse 78   Ht $R'5\' 2"'IW$  (1.575 m)   Wt 294 lb 6.4 oz (133.5 kg)   SpO2 99%   BMI 53.85 kg/m   BP/Weight 05/23/2020 11/01/2019 5/63/8756  Systolic BP 433 295 188  Diastolic BP 79 95 416  Wt. (Lbs) 294.4 315 -  BMI 53.85 57.61 -      Physical Exam Constitutional:      Appearance: She is well-developed. She is obese.  Neck:     Vascular: No JVD.  Cardiovascular:     Rate and Rhythm: Normal rate.      Heart sounds: Normal heart sounds. No murmur heard.   Pulmonary:     Effort: Pulmonary effort is normal.     Breath sounds: Normal breath sounds. No wheezing or rales.  Chest:     Chest wall: No tenderness.  Abdominal:     General: Bowel sounds are normal. There is no distension.     Palpations: Abdomen is soft. There is no mass.     Tenderness: There is no abdominal tenderness.  Musculoskeletal:        General: Normal range of motion.     Right lower leg: No edema.     Left lower leg: No edema.  Neurological:     Mental Status: She is alert and oriented to person, place, and time.  Psychiatric:        Mood and Affect: Mood normal.     CMP Latest Ref Rng & Units 10/02/2019 08/02/2019 07/19/2019  Glucose 70 - 99 mg/dL 166(H) 89 101(H)  BUN 6 - 20 mg/dL $Remove'16 14 9  'cOJEeDo$ Creatinine 0.44 - 1.00 mg/dL 1.20(H) 0.80 0.79  Sodium 135 - 145 mmol/L 137 139 139  Potassium 3.5 - 5.1 mmol/L 3.0(L) 3.3(L) 3.8  Chloride 98 - 111 mmol/L 100 98 97  CO2 22 - 32 mmol/L $RemoveB'27 25 25  'dbqklXBO$ Calcium 8.9 - 10.3 mg/dL 8.8(L) 9.0 9.5  Total Protein 6.5 - 8.1 g/dL - - -  Total Bilirubin 0.3 - 1.2 mg/dL - - -  Alkaline Phos 38 - 126 U/L - - -  AST 15 - 41 U/L - - -  ALT 0 - 44 U/L - - -    Lipid Panel     Component Value Date/Time   CHOL 183 08/02/2019 1649   TRIG 314 (H) 08/02/2019 1649   HDL 44 08/02/2019 1649   CHOLHDL 4.2 08/02/2019 1649   LDLCALC 87 08/02/2019 1649    CBC    Component Value Date/Time   WBC 11.2 (H) 10/02/2019 2227   RBC 3.77 (L) 10/02/2019 2227   HGB 9.6 (L) 10/02/2019 2227   HGB 9.8 (L) 09/28/2019 1030   HCT 32.6 (L) 10/02/2019 2227   HCT 31.9 (L) 09/28/2019 1030   PLT 445 (H) 10/02/2019 2227   PLT 458 (H) 09/28/2019 1030   MCV 86.5 10/02/2019 2227   MCV 81 09/28/2019 1030   MCH 25.5 (L) 10/02/2019 2227   MCHC 29.4 (L) 10/02/2019 2227   RDW 16.7 (H) 10/02/2019 2227   RDW 16.4 (H) 09/28/2019 1030   LYMPHSABS 2.8 05/16/2019 1118   MONOABS 0.6 05/16/2019 1118   EOSABS 0.3  05/16/2019 1118   BASOSABS 0.1 05/16/2019 1118    No results found for: HGBA1C  Assessment & Plan:  1. Essential hypertension Controlled Counseled on blood pressure goal of less than 130/80, low-sodium, DASH diet, medication compliance, 150 minutes  of moderate intensity exercise per week. Discussed medication compliance, adverse effects. - losartan (COZAAR) 25 MG tablet; Take 1 tablet (25 mg total) by mouth daily.  Dispense: 90 tablet; Refill: 1 - triamterene-hydrochlorothiazide (MAXZIDE-25) 37.5-25 MG tablet; Take 1 tablet by mouth daily.  Dispense: 90 tablet; Refill: 1 - CMP14+EGFR; Future - Lipid panel; Future  2. Body mass index 50.0-59.9, adult (HCC) - Amb Referral to Bariatric Surgery  3. Menorrhagia with regular cycle We will need to evaluate for presence of fibroids as etiology of menorrhagia - US Pelvic Complete With Transvaginal; Future  4. Iron deficiency anemia due to chronic blood loss - CBC with Differential/Platelet; Future - Ambulatory referral to Hematology  5. Vitamin deficiency She has a previous history of vitamin D deficiency - VITAMIN D 25 Hydroxy (Vit-D Deficiency, Fractures); Future - Vitamin B12; Future    Meds ordered this encounter  Medications  . losartan (COZAAR) 25 MG tablet    Sig: Take 1 tablet (25 mg total) by mouth daily.    Dispense:  90 tablet    Refill:  1  . triamterene-hydrochlorothiazide (MAXZIDE-25) 37.5-25 MG tablet    Sig: Take 1 tablet by mouth daily.    Dispense:  90 tablet    Refill:  1    Follow-up: Return in about 3 months (around 08/20/2020) for Follow-up of chronic conditions.       Charlott Rakes, MD, FAAFP. [facility] [place] [facility] [place]   05/23/2020, 3:52 PM

## 2020-05-23 NOTE — Progress Notes (Signed)
Pt has heavy menstrual cycle

## 2020-05-24 ENCOUNTER — Ambulatory Visit: Payer: No Typology Code available for payment source | Attending: Family Medicine

## 2020-05-24 ENCOUNTER — Telehealth: Payer: Self-pay | Admitting: Hematology and Oncology

## 2020-05-24 DIAGNOSIS — E569 Vitamin deficiency, unspecified: Secondary | ICD-10-CM

## 2020-05-24 DIAGNOSIS — D500 Iron deficiency anemia secondary to blood loss (chronic): Secondary | ICD-10-CM

## 2020-05-24 DIAGNOSIS — I10 Essential (primary) hypertension: Secondary | ICD-10-CM

## 2020-05-24 NOTE — Telephone Encounter (Signed)
Received a new hem referral from Dr. Alvis Lemmings for IDA. Pt has been cld and scheduled to see Dr. Al Pimple on 2/23 at 1120am. Pt aware to arrive 20 minutes early.

## 2020-05-25 ENCOUNTER — Other Ambulatory Visit: Payer: Self-pay | Admitting: Family Medicine

## 2020-05-25 LAB — CMP14+EGFR
ALT: 11 IU/L (ref 0–32)
AST: 9 IU/L (ref 0–40)
Albumin/Globulin Ratio: 1.6 (ref 1.2–2.2)
Albumin: 3.9 g/dL (ref 3.8–4.8)
Alkaline Phosphatase: 78 IU/L (ref 44–121)
BUN/Creatinine Ratio: 17 (ref 9–23)
BUN: 16 mg/dL (ref 6–24)
Bilirubin Total: <0.2 mg/dL (ref 0.0–1.2)
CO2: 25 mmol/L (ref 20–29)
Calcium: 8.9 mg/dL (ref 8.7–10.2)
Chloride: 100 mmol/L (ref 96–106)
Creatinine, Ser: 0.94 mg/dL (ref 0.57–1.00)
GFR calc Af Amer: 85 mL/min/{1.73_m2} (ref >59)
GFR calc non Af Amer: 74 mL/min/{1.73_m2} (ref >59)
Globulin, Total: 2.4 g/dL (ref 1.5–4.5)
Glucose: 106 mg/dL — ABNORMAL HIGH (ref 65–99)
Potassium: 3.6 mmol/L (ref 3.5–5.2)
Sodium: 141 mmol/L (ref 134–144)
Total Protein: 6.3 g/dL (ref 6.0–8.5)

## 2020-05-25 LAB — VITAMIN D 25 HYDROXY (VIT D DEFICIENCY, FRACTURES): Vit D, 25-Hydroxy: 24.5 ng/mL — ABNORMAL LOW (ref 30.0–100.0)

## 2020-05-25 LAB — CBC WITH DIFFERENTIAL/PLATELET
Basophils Absolute: 0.1 10*3/uL (ref 0.0–0.2)
Basos: 1 %
EOS (ABSOLUTE): 0.3 10*3/uL (ref 0.0–0.4)
Eos: 4 %
Hematocrit: 26.5 % — ABNORMAL LOW (ref 34.0–46.6)
Hemoglobin: 8.2 g/dL — ABNORMAL LOW (ref 11.1–15.9)
Immature Grans (Abs): 0 10*3/uL (ref 0.0–0.1)
Immature Granulocytes: 0 %
Lymphocytes Absolute: 1.8 10*3/uL (ref 0.7–3.1)
Lymphs: 22 %
MCH: 23.6 pg — ABNORMAL LOW (ref 26.6–33.0)
MCHC: 30.9 g/dL — ABNORMAL LOW (ref 31.5–35.7)
MCV: 76 fL — ABNORMAL LOW (ref 79–97)
Monocytes Absolute: 0.4 10*3/uL (ref 0.1–0.9)
Monocytes: 5 %
Neutrophils Absolute: 5.7 10*3/uL (ref 1.4–7.0)
Neutrophils: 68 %
Platelets: 489 10*3/uL — ABNORMAL HIGH (ref 150–450)
RBC: 3.47 x10E6/uL — ABNORMAL LOW (ref 3.77–5.28)
RDW: 14.6 % (ref 11.7–15.4)
WBC: 8.3 10*3/uL (ref 3.4–10.8)

## 2020-05-25 LAB — LIPID PANEL
Chol/HDL Ratio: 4.8 ratio — ABNORMAL HIGH (ref 0.0–4.4)
Cholesterol, Total: 207 mg/dL — ABNORMAL HIGH (ref 100–199)
HDL: 43 mg/dL (ref >39)
LDL Chol Calc (NIH): 119 mg/dL — ABNORMAL HIGH (ref 0–99)
Triglycerides: 260 mg/dL — ABNORMAL HIGH (ref 0–149)
VLDL Cholesterol Cal: 45 mg/dL — ABNORMAL HIGH (ref 5–40)

## 2020-05-25 LAB — VITAMIN B12: Vitamin B-12: 824 pg/mL (ref 232–1245)

## 2020-05-25 MED ORDER — PRAVASTATIN SODIUM 20 MG PO TABS: 20.0000 mg | ORAL_TABLET | Freq: Every day | ORAL | 3 refills | Status: AC

## 2020-05-28 ENCOUNTER — Telehealth: Payer: Self-pay

## 2020-05-28 ENCOUNTER — Telehealth: Payer: Self-pay | Admitting: Family Medicine

## 2020-05-28 NOTE — Telephone Encounter (Signed)
-----   Message from Hoy Register, MD sent at 05/25/2020 11:04 AM EST ----- Labs reveal elevated cholesterol, I have sent a prescription for Pravastatin to her pharmacy.  Vitamin D is also low and could cause fatigue.  A prescription for replacement has been sent to her pharmacy and we will repeat her level after she has completed her therapy.  She is anemic with a hematocrit of 26.5.  This will be assessed at her visit with Hematology

## 2020-05-28 NOTE — Telephone Encounter (Signed)
Pt is calling back for lab results. CB- [phone]

## 2020-05-28 NOTE — Telephone Encounter (Signed)
Pt was called and VM was left

## 2020-05-28 NOTE — Telephone Encounter (Signed)
Copied from CRM [id number]. Topic: General - Other >> May 28, 2020  3:20 PM Tamela Oddi wrote: Reason for CRM: Patient called to ask the results of her labs as soon as possible.  She stated they were done last week and she is anemic and would like the know the results.  Please call to update at [phone]

## 2020-05-28 NOTE — Telephone Encounter (Signed)
Patient was called and a voicemail was left informing patient to return phone call for lab results. 

## 2020-05-29 NOTE — Telephone Encounter (Signed)
Pt is calling back and would like blood work results 

## 2020-05-29 NOTE — Telephone Encounter (Signed)
Pt was called and VM was left.  Ok to give results

## 2020-05-30 ENCOUNTER — Telehealth: Payer: Self-pay

## 2020-05-30 MED ORDER — VITAMIN D 25 MCG (1000 UNIT) PO TABS: 1000.0000 [IU] | ORAL_TABLET | Freq: Every day | ORAL | 3 refills | Status: AC

## 2020-05-30 NOTE — Telephone Encounter (Signed)
[facility] D [facility] [address].

## 2020-05-30 NOTE — Telephone Encounter (Signed)
Done

## 2020-06-04 ENCOUNTER — Ambulatory Visit (HOSPITAL_COMMUNITY): Payer: No Typology Code available for payment source

## 2020-06-06 ENCOUNTER — Inpatient Hospital Stay
Payer: No Typology Code available for payment source | Attending: Hematology and Oncology | Admitting: Hematology and Oncology

## 2020-06-06 ENCOUNTER — Inpatient Hospital Stay: Payer: No Typology Code available for payment source

## 2020-07-12 ENCOUNTER — Emergency Department (HOSPITAL_COMMUNITY)
Admission: EM | Admit: 2020-07-12 | Discharge: 2020-07-12 | Disposition: A | Discharge status: Home / Self Care | Payer: No Typology Code available for payment source | Attending: Emergency Medicine | Admitting: Emergency Medicine

## 2020-07-12 ENCOUNTER — Emergency Department (HOSPITAL_COMMUNITY): Payer: No Typology Code available for payment source

## 2020-07-12 ENCOUNTER — Other Ambulatory Visit: Payer: Self-pay

## 2020-07-12 DIAGNOSIS — I10 Essential (primary) hypertension: Secondary | ICD-10-CM | POA: Diagnosis not present

## 2020-07-12 DIAGNOSIS — R42 Dizziness and giddiness: Secondary | ICD-10-CM | POA: Insufficient documentation

## 2020-07-12 DIAGNOSIS — Y92410 Unspecified street and highway as the place of occurrence of the external cause: Secondary | ICD-10-CM | POA: Diagnosis not present

## 2020-07-12 DIAGNOSIS — E876 Hypokalemia: Secondary | ICD-10-CM | POA: Diagnosis not present

## 2020-07-12 DIAGNOSIS — Z79899 Other long term (current) drug therapy: Secondary | ICD-10-CM | POA: Diagnosis not present

## 2020-07-12 DIAGNOSIS — S199XXA Unspecified injury of neck, initial encounter: Secondary | ICD-10-CM | POA: Diagnosis present

## 2020-07-12 DIAGNOSIS — M549 Dorsalgia, unspecified: Secondary | ICD-10-CM | POA: Insufficient documentation

## 2020-07-12 DIAGNOSIS — R55 Syncope and collapse: Secondary | ICD-10-CM | POA: Diagnosis not present

## 2020-07-12 DIAGNOSIS — S161XXA Strain of muscle, fascia and tendon at neck level, initial encounter: Secondary | ICD-10-CM | POA: Diagnosis not present

## 2020-07-12 DIAGNOSIS — F17200 Nicotine dependence, unspecified, uncomplicated: Secondary | ICD-10-CM | POA: Diagnosis not present

## 2020-07-12 LAB — CBC WITH DIFFERENTIAL/PLATELET
Abs Immature Granulocytes: 0.02 10*3/uL (ref 0.00–0.07)
Basophils Absolute: 0.1 10*3/uL (ref 0.0–0.1)
Basophils Relative: 1 %
Eosinophils Absolute: 0.3 10*3/uL (ref 0.0–0.5)
Eosinophils Relative: 3 %
HCT: 30.5 % — ABNORMAL LOW (ref 36.0–46.0)
Hemoglobin: 8.6 g/dL — ABNORMAL LOW (ref 12.0–15.0)
Immature Granulocytes: 0 %
Lymphocytes Relative: 33 %
Lymphs Abs: 3.4 10*3/uL (ref 0.7–4.0)
MCH: 22.5 pg — ABNORMAL LOW (ref 26.0–34.0)
MCHC: 28.2 g/dL — ABNORMAL LOW (ref 30.0–36.0)
MCV: 79.8 fL — ABNORMAL LOW (ref 80.0–100.0)
Monocytes Absolute: 0.6 10*3/uL (ref 0.1–1.0)
Monocytes Relative: 6 %
Neutro Abs: 5.9 10*3/uL (ref 1.7–7.7)
Neutrophils Relative %: 57 %
Platelets: 519 10*3/uL — ABNORMAL HIGH (ref 150–400)
RBC: 3.82 MIL/uL — ABNORMAL LOW (ref 3.87–5.11)
RDW: 15.5 % (ref 11.5–15.5)
WBC: 10.3 10*3/uL (ref 4.0–10.5)
nRBC: 0 % (ref 0.0–0.2)

## 2020-07-12 LAB — COMPREHENSIVE METABOLIC PANEL
ALT: 12 U/L (ref 0–44)
AST: 11 U/L — ABNORMAL LOW (ref 15–41)
Albumin: 3.3 g/dL — ABNORMAL LOW (ref 3.5–5.0)
Alkaline Phosphatase: 63 U/L (ref 38–126)
Anion gap: 8 (ref 5–15)
BUN: 11 mg/dL (ref 6–20)
CO2: 31 mmol/L (ref 22–32)
Calcium: 8.9 mg/dL (ref 8.9–10.3)
Chloride: 98 mmol/L (ref 98–111)
Creatinine, Ser: 0.84 mg/dL (ref 0.44–1.00)
GFR, Estimated: >60 mL/min (ref >60)
Glucose, Bld: 98 mg/dL (ref 70–99)
Potassium: 2.9 mmol/L — ABNORMAL LOW (ref 3.5–5.1)
Sodium: 137 mmol/L (ref 135–145)
Total Bilirubin: 0.5 mg/dL (ref 0.3–1.2)
Total Protein: 6.8 g/dL (ref 6.5–8.1)

## 2020-07-12 LAB — I-STAT BETA HCG BLOOD, ED (MC, WL, AP ONLY): I-stat hCG, quantitative: <5 m[IU]/mL (ref <5)

## 2020-07-12 MED ORDER — POTASSIUM CHLORIDE CRYS ER 20 MEQ PO TBCR: 20.0000 meq | EXTENDED_RELEASE_TABLET | Freq: Two times a day (BID) | ORAL | 0 refills | Status: AC

## 2020-07-12 MED ORDER — CYCLOBENZAPRINE HCL 10 MG PO TABS: 10.0000 mg | ORAL_TABLET | Freq: Two times a day (BID) | ORAL | 0 refills | Status: AC | PRN

## 2020-07-12 MED ORDER — POTASSIUM CHLORIDE CRYS ER 20 MEQ PO TBCR
40.0000 meq | EXTENDED_RELEASE_TABLET | Freq: Once | ORAL | Status: AC
  Administered 2020-07-12: 40 meq via ORAL
  Filled 2020-07-12: qty 2

## 2020-07-12 MED ORDER — LIDOCAINE 5 % EX PTCH: 1.0000 | MEDICATED_PATCH | CUTANEOUS | 0 refills | Status: AC

## 2020-07-12 NOTE — ED Notes (Signed)
Pt climbed out of bed and is sitting in chair, awaiting phys for update.
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
## 2020-07-12 NOTE — ED Notes (Signed)
Pt transported to Xray. 

## 2020-07-12 NOTE — ED Notes (Signed)
Pt provided ice pack 

## 2020-07-12 NOTE — ED Notes (Signed)
Pt is requesting to go home; note sent to provider to make her aware.
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
## 2020-07-12 NOTE — ED Notes (Signed)
Patient ambulated to the bathroom slowly, gait steady. No distress noted at d/c.

## 2020-08-20 ENCOUNTER — Ambulatory Visit: Payer: Self-pay | Admitting: Family Medicine
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
